# Patient Record
Sex: Male | Born: 1955 | Race: Black or African American | Hispanic: No | Marital: Married | Smoking: Never smoker
Health system: Southern US, Community
[De-identification: ages and names within clinical notes are randomized; demographics above are authoritative.]

## PROBLEM LIST (undated history)

## (undated) DIAGNOSIS — K429 Umbilical hernia without obstruction or gangrene: Secondary | ICD-10-CM

## (undated) DIAGNOSIS — I10 Essential (primary) hypertension: Secondary | ICD-10-CM

## (undated) DIAGNOSIS — E039 Hypothyroidism, unspecified: Secondary | ICD-10-CM

## (undated) DIAGNOSIS — I4819 Other persistent atrial fibrillation: Secondary | ICD-10-CM

## (undated) DIAGNOSIS — E786 Lipoprotein deficiency: Secondary | ICD-10-CM

## (undated) HISTORY — DX: Lipoprotein deficiency: E78.6

## (undated) HISTORY — DX: Hypothyroidism, unspecified: E03.9

## (undated) HISTORY — DX: Essential (primary) hypertension: I10

## (undated) HISTORY — PX: ABDOMINAL SURGERY: SHX537

## (undated) HISTORY — DX: Umbilical hernia without obstruction or gangrene: K42.9

## (undated) HISTORY — DX: Other persistent atrial fibrillation: I48.19

## (undated) HISTORY — PX: OTHER SURGICAL HISTORY: SHX169

---

## 2004-10-04 ENCOUNTER — Ambulatory Visit: Payer: Self-pay | Admitting: Internal Medicine

## 2005-04-10 ENCOUNTER — Ambulatory Visit: Payer: Self-pay | Admitting: Internal Medicine

## 2006-01-24 ENCOUNTER — Ambulatory Visit: Payer: Self-pay | Admitting: Internal Medicine

## 2006-01-31 ENCOUNTER — Ambulatory Visit: Payer: Self-pay | Admitting: Internal Medicine

## 2006-04-17 ENCOUNTER — Ambulatory Visit: Payer: Self-pay | Admitting: Internal Medicine

## 2006-07-02 ENCOUNTER — Ambulatory Visit: Payer: Self-pay | Admitting: Internal Medicine

## 2006-10-03 ENCOUNTER — Ambulatory Visit: Payer: Self-pay | Admitting: Internal Medicine

## 2007-02-14 ENCOUNTER — Ambulatory Visit: Payer: Self-pay | Admitting: Internal Medicine

## 2007-02-14 LAB — CONVERTED CEMR LAB
ALT: 19 units/L (ref 0–40)
AST: 22 units/L (ref 0–37)
Calcium: 8.8 mg/dL (ref 8.4–10.5)
Chloride: 108 meq/L (ref 96–112)
Cholesterol: 187 mg/dL (ref 0–200)
Creatinine, Ser: 0.9 mg/dL (ref 0.4–1.5)
HDL: 31.9 mg/dL — ABNORMAL LOW (ref >39.0)
LDL Cholesterol: 136 mg/dL — ABNORMAL HIGH (ref 0–99)
PSA: 0.78 ng/mL (ref 0.10–4.00)
TSH: 1.01 microintl units/mL (ref 0.35–5.50)
Total CHOL/HDL Ratio: 5.9
Triglycerides: 96 mg/dL (ref 0–149)

## 2007-03-23 ENCOUNTER — Emergency Department (HOSPITAL_COMMUNITY): Admission: EM | Admit: 2007-03-23 | Discharge: 2007-03-23 | Payer: Self-pay | Admitting: Emergency Medicine

## 2007-04-05 ENCOUNTER — Ambulatory Visit: Payer: Self-pay | Admitting: Internal Medicine

## 2007-05-03 ENCOUNTER — Ambulatory Visit: Payer: Self-pay | Admitting: Internal Medicine

## 2007-05-21 DIAGNOSIS — E039 Hypothyroidism, unspecified: Secondary | ICD-10-CM | POA: Insufficient documentation

## 2007-05-22 ENCOUNTER — Ambulatory Visit: Payer: Self-pay | Admitting: Internal Medicine

## 2007-06-07 ENCOUNTER — Ambulatory Visit: Payer: Self-pay | Admitting: Internal Medicine

## 2007-06-07 DIAGNOSIS — J45909 Unspecified asthma, uncomplicated: Secondary | ICD-10-CM | POA: Insufficient documentation

## 2007-06-07 DIAGNOSIS — I10 Essential (primary) hypertension: Secondary | ICD-10-CM | POA: Insufficient documentation

## 2007-07-22 ENCOUNTER — Ambulatory Visit: Payer: Self-pay | Admitting: Internal Medicine

## 2007-07-22 DIAGNOSIS — E785 Hyperlipidemia, unspecified: Secondary | ICD-10-CM | POA: Insufficient documentation

## 2007-07-22 LAB — CONVERTED CEMR LAB
Cholesterol: 170 mg/dL (ref 0–200)
LDL Cholesterol: 129 mg/dL — ABNORMAL HIGH (ref 0–99)
Triglycerides: 97 mg/dL (ref 0–149)

## 2007-07-29 ENCOUNTER — Ambulatory Visit: Payer: Self-pay | Admitting: Internal Medicine

## 2007-08-06 ENCOUNTER — Telehealth: Payer: Self-pay | Admitting: Internal Medicine

## 2007-09-16 ENCOUNTER — Telehealth: Payer: Self-pay | Admitting: *Deleted

## 2007-09-18 ENCOUNTER — Encounter: Payer: Self-pay | Admitting: Internal Medicine

## 2007-09-20 ENCOUNTER — Ambulatory Visit: Payer: Self-pay | Admitting: Internal Medicine

## 2007-09-23 LAB — CONVERTED CEMR LAB
ALT: 15 units/L (ref 0–53)
AST: 19 units/L (ref 0–37)
Albumin: 3.8 g/dL (ref 3.5–5.2)
LDL Cholesterol: 98 mg/dL (ref 0–99)
TSH: 0.06 microintl units/mL — ABNORMAL LOW (ref 0.35–5.50)
VLDL: 9 mg/dL (ref 0–40)

## 2007-09-27 ENCOUNTER — Ambulatory Visit: Payer: Self-pay | Admitting: Internal Medicine

## 2007-09-27 LAB — CONVERTED CEMR LAB: LDL Goal: 130 mg/dL

## 2007-10-01 ENCOUNTER — Encounter: Payer: Self-pay | Admitting: Internal Medicine

## 2008-04-24 ENCOUNTER — Ambulatory Visit: Payer: Self-pay | Admitting: Internal Medicine

## 2008-04-27 LAB — CONVERTED CEMR LAB
BUN: 11 mg/dL (ref 6–23)
CO2: 31 meq/L (ref 19–32)
Calcium: 8.9 mg/dL (ref 8.4–10.5)
Chloride: 104 meq/L (ref 96–112)
GFR calc Af Amer: 114 mL/min
Sodium: 140 meq/L (ref 135–145)
TSH: 2.32 microintl units/mL (ref 0.35–5.50)

## 2008-05-09 IMAGING — CR DG CHEST 2V
2 series · 2 of 2 positions shown · non-contrast
Comparison: 03/23/2007.

Exam: Chest, 2 views.

HISTORY: Cough for 2 weeks.

[view not recorded (1 of 2)]
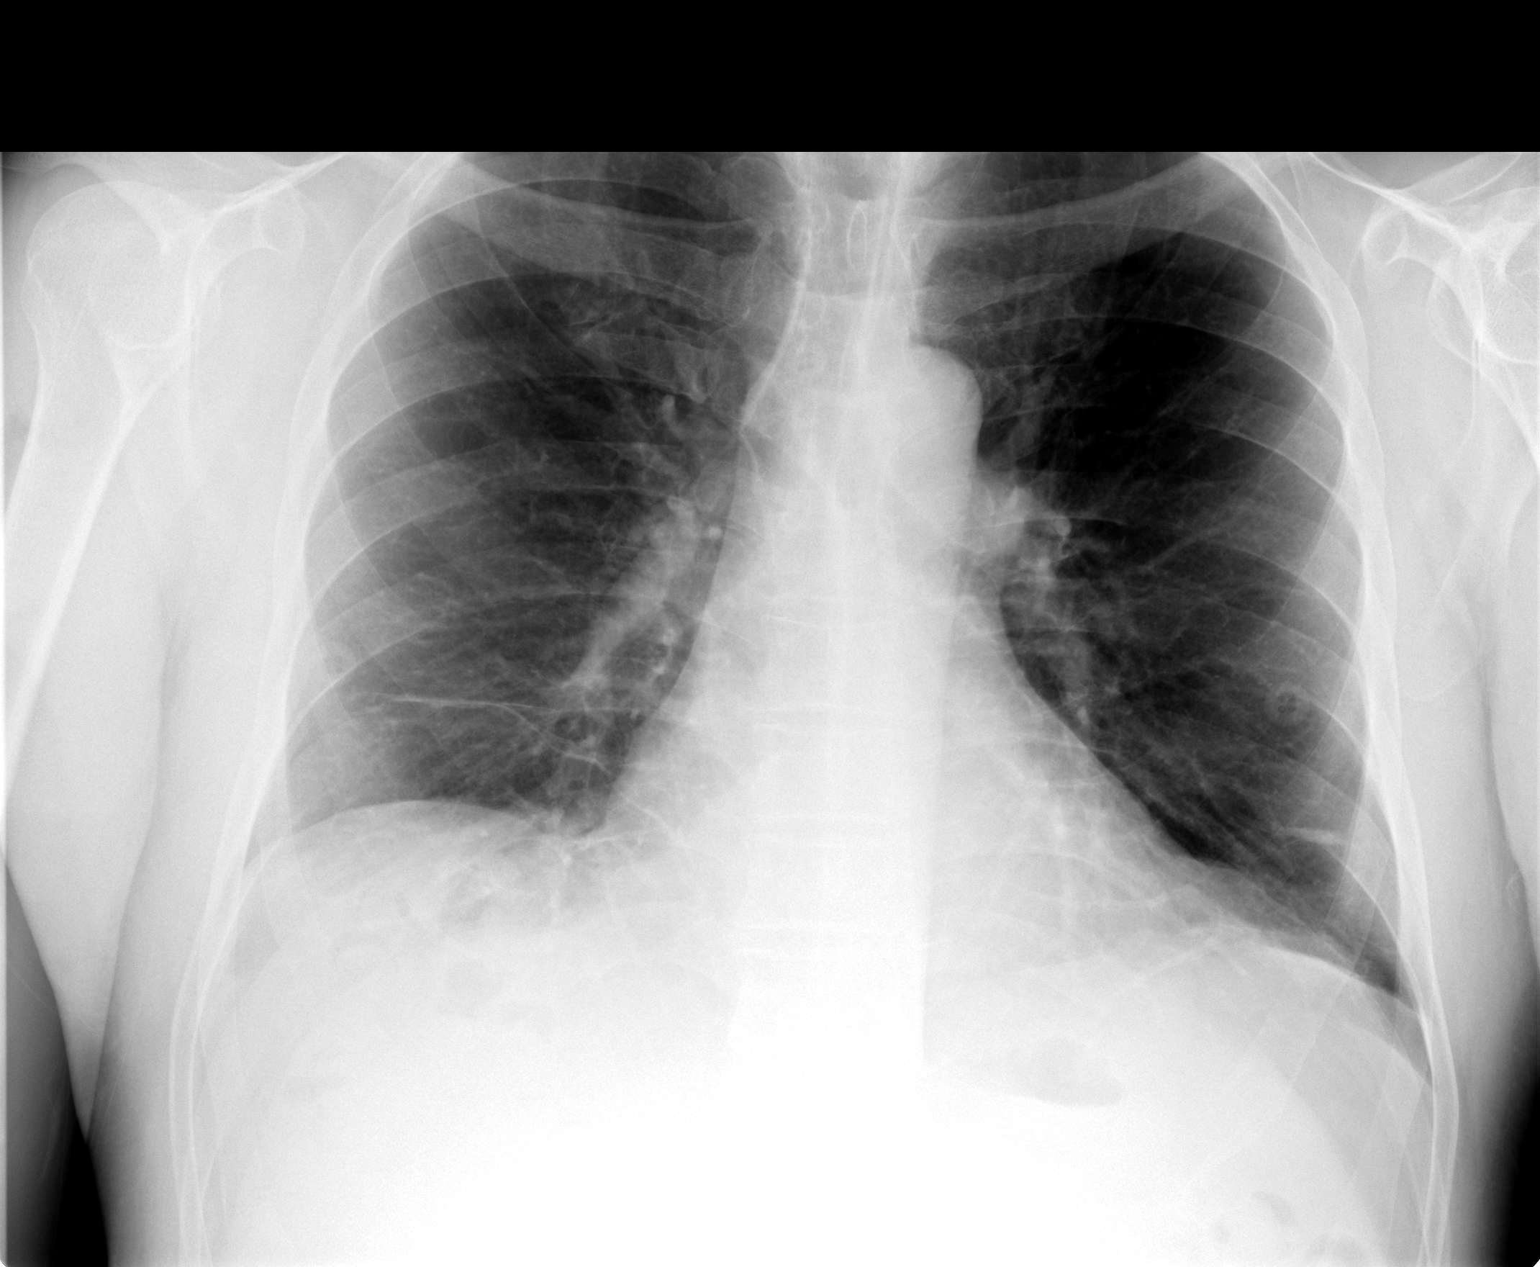

[view not recorded (2 of 2)]
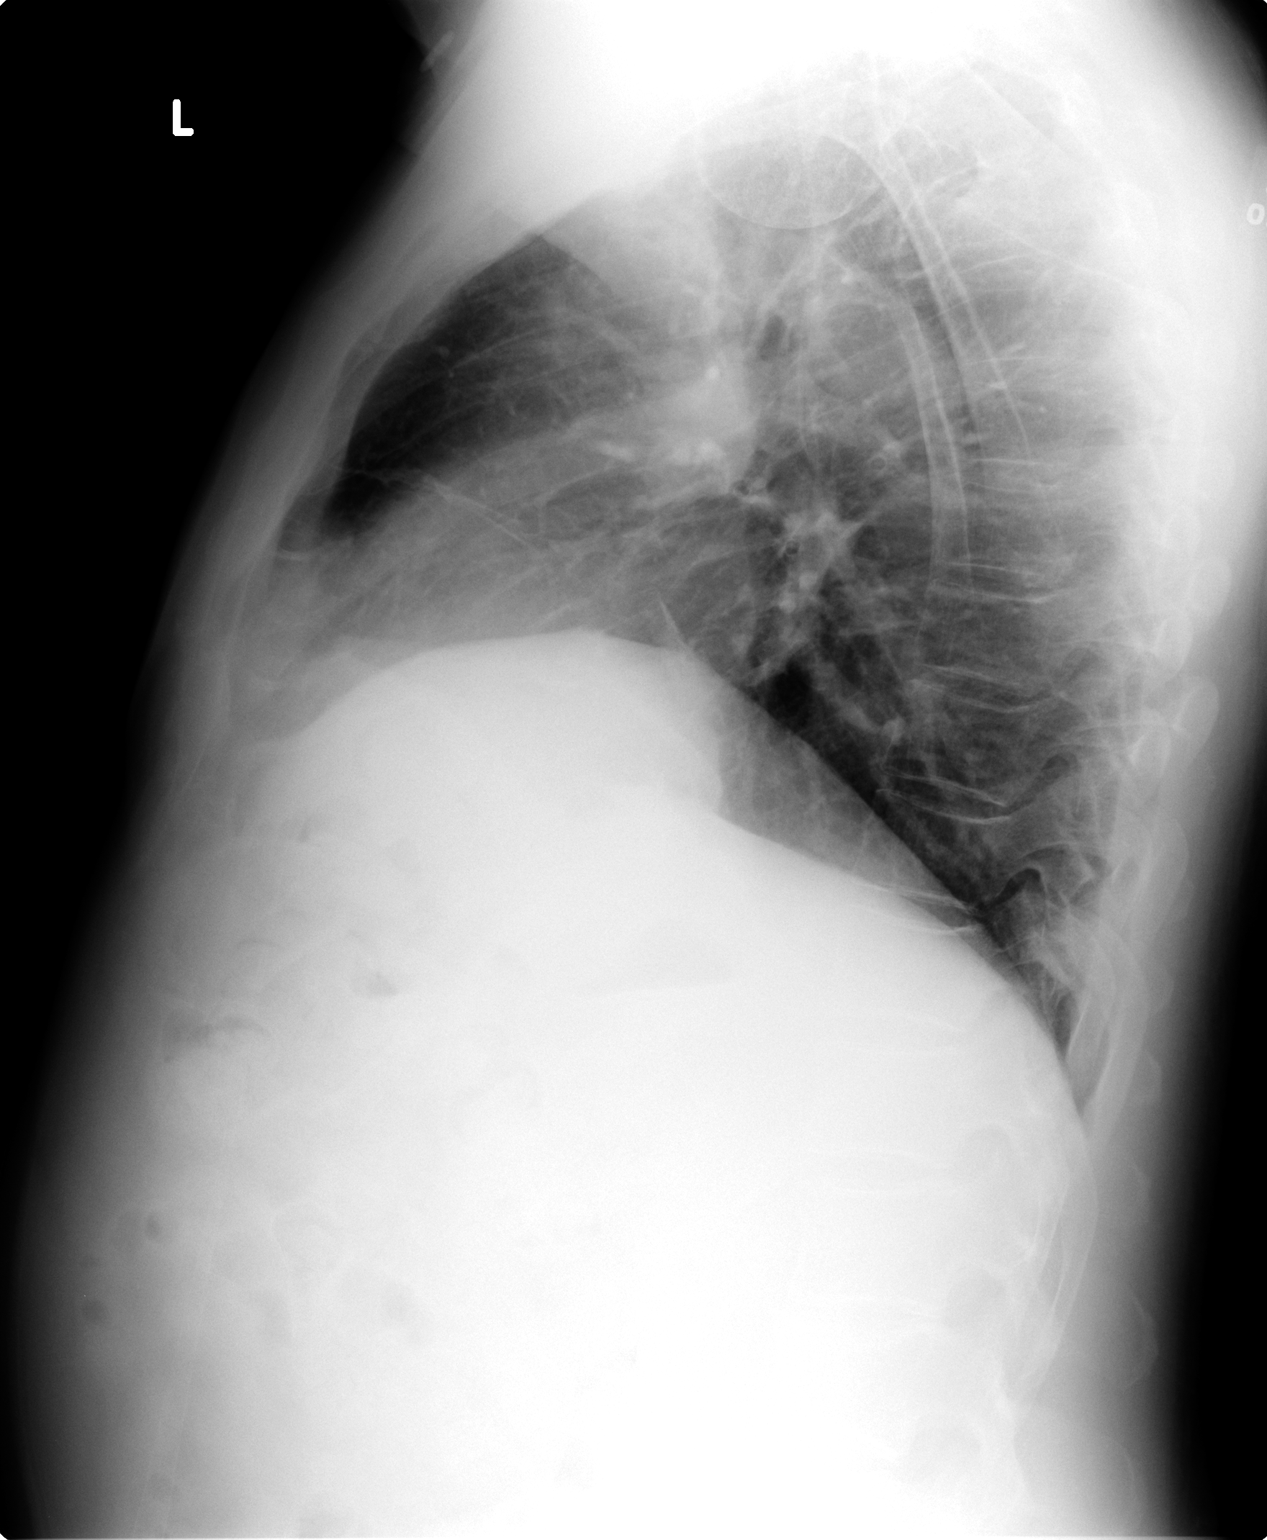

[2 of 2 positions shown; findings below may reference images not displayed]

FINDINGS: Heart size normal.

No pleural effusions or pulmonary edema.

There are low lung volumes with scarring at both lung bases, similar to prior
exam.

No acute airspace disease.
IMPRESSION: 1. Stable low lung volumes with bibasilar scarring.

## 2008-10-12 ENCOUNTER — Telehealth: Payer: Self-pay | Admitting: *Deleted

## 2008-10-16 ENCOUNTER — Telehealth: Payer: Self-pay | Admitting: *Deleted

## 2009-01-15 ENCOUNTER — Ambulatory Visit: Payer: Self-pay | Admitting: Internal Medicine

## 2009-01-15 DIAGNOSIS — R079 Chest pain, unspecified: Secondary | ICD-10-CM | POA: Insufficient documentation

## 2009-01-22 ENCOUNTER — Telehealth: Payer: Self-pay | Admitting: *Deleted

## 2009-03-09 ENCOUNTER — Telehealth (INDEPENDENT_AMBULATORY_CARE_PROVIDER_SITE_OTHER): Payer: Self-pay | Admitting: *Deleted

## 2009-03-12 ENCOUNTER — Ambulatory Visit: Payer: Self-pay | Admitting: Internal Medicine

## 2009-03-12 DIAGNOSIS — J309 Allergic rhinitis, unspecified: Secondary | ICD-10-CM | POA: Insufficient documentation

## 2009-08-10 ENCOUNTER — Telehealth: Payer: Self-pay | Admitting: Internal Medicine

## 2009-09-17 ENCOUNTER — Ambulatory Visit: Payer: Self-pay | Admitting: Internal Medicine

## 2009-09-17 LAB — CONVERTED CEMR LAB
ALT: 18 units/L (ref 0–53)
AST: 20 units/L (ref 0–37)
Albumin: 3.9 g/dL (ref 3.5–5.2)
HDL: 28.6 mg/dL — ABNORMAL LOW (ref >39.00)
Total CHOL/HDL Ratio: 6
Triglycerides: 108 mg/dL (ref 0.0–149.0)
VLDL: 21.6 mg/dL (ref 0.0–40.0)

## 2009-10-13 ENCOUNTER — Ambulatory Visit: Payer: Self-pay | Admitting: Internal Medicine

## 2009-10-15 ENCOUNTER — Telehealth: Payer: Self-pay | Admitting: *Deleted

## 2009-12-27 ENCOUNTER — Telehealth: Payer: Self-pay | Admitting: Internal Medicine

## 2009-12-27 ENCOUNTER — Ambulatory Visit: Payer: Self-pay | Admitting: Internal Medicine

## 2009-12-27 DIAGNOSIS — J111 Influenza due to unidentified influenza virus with other respiratory manifestations: Secondary | ICD-10-CM | POA: Insufficient documentation

## 2010-01-04 ENCOUNTER — Telehealth: Payer: Self-pay | Admitting: Internal Medicine

## 2010-01-28 ENCOUNTER — Ambulatory Visit: Payer: Self-pay | Admitting: Internal Medicine

## 2010-01-28 LAB — CONVERTED CEMR LAB
ALT: 16 units/L (ref 0–53)
Alkaline Phosphatase: 91 units/L (ref 39–117)
Basophils Relative: 0.6 % (ref 0.0–3.0)
Bilirubin Urine: NEGATIVE
Bilirubin, Direct: 0.1 mg/dL (ref 0.0–0.3)
Blood in Urine, dipstick: NEGATIVE
CO2: 32 meq/L (ref 19–32)
Calcium: 9.2 mg/dL (ref 8.4–10.5)
Chloride: 105 meq/L (ref 96–112)
Cholesterol: 187 mg/dL (ref 0–200)
Eosinophils Relative: 2.1 % (ref 0.0–5.0)
GFR calc non Af Amer: 99.53 mL/min (ref >60)
Glucose, Bld: 97 mg/dL (ref 70–99)
Glucose, Urine, Semiquant: NEGATIVE
Lymphocytes Relative: 23.1 % (ref 12.0–46.0)
Neutrophils Relative %: 65.1 % (ref 43.0–77.0)
PSA: 0.77 ng/mL (ref 0.10–4.00)
Platelets: 199 10*3/uL (ref 150.0–400.0)
Potassium: 3.8 meq/L (ref 3.5–5.1)
RBC: 5.21 M/uL (ref 4.22–5.81)
Sodium: 143 meq/L (ref 135–145)
Specific Gravity, Urine: 1.02
Total Bilirubin: 0.6 mg/dL (ref 0.3–1.2)
Total Protein: 8.3 g/dL (ref 6.0–8.3)
WBC Urine, dipstick: NEGATIVE
WBC: 7 10*3/uL (ref 4.5–10.5)
pH: 7

## 2010-03-29 ENCOUNTER — Ambulatory Visit: Payer: Self-pay | Admitting: Internal Medicine

## 2010-08-01 ENCOUNTER — Telehealth: Payer: Self-pay | Admitting: *Deleted

## 2010-12-04 LAB — CONVERTED CEMR LAB
ALT: 17 units/L (ref 0–53)
AST: 19 units/L (ref 0–37)
Albumin: 3.6 g/dL (ref 3.5–5.2)
Basophils Relative: 0.9 % (ref 0.0–3.0)
CO2: 32 meq/L (ref 19–32)
Creatinine, Ser: 0.8 mg/dL (ref 0.4–1.5)
GFR calc Af Amer: 130 mL/min
Glucose, Bld: 99 mg/dL (ref 70–99)
HCT: 43.9 % (ref 39.0–52.0)
HDL: 25 mg/dL — ABNORMAL LOW (ref >39.0)
Hemoglobin: 15.2 g/dL (ref 13.0–17.0)
LDL Cholesterol: 111 mg/dL — ABNORMAL HIGH (ref 0–99)
Magnesium: 2 mg/dL (ref 1.5–2.5)
Neutro Abs: 3.2 10*3/uL (ref 1.4–7.7)
Platelets: 149 10*3/uL — ABNORMAL LOW (ref 150–400)
Potassium: 4 meq/L (ref 3.5–5.1)
RBC: 5.13 M/uL (ref 4.22–5.81)
Sodium: 141 meq/L (ref 135–145)
T3, Free: 2.9 pg/mL (ref 2.3–4.2)
Total Bilirubin: 0.8 mg/dL (ref 0.3–1.2)
Total CHOL/HDL Ratio: 6.5
Total Protein: 7.8 g/dL (ref 6.0–8.3)
VLDL: 26 mg/dL (ref 0–40)

## 2010-12-08 NOTE — Progress Notes (Signed)
Summary: call triaged  Phone Note Refill Request   Refills Requested: Medication #1:  PROFAIR HFA (90 BASE) MCG/ACT AERS (Albuterol Sulfate)  1-2 puffs every 6 hours as needed for asthma   Notes: [facility] [address]  Not on current med list but pt adv that he needs this med to help him breath better.   Initial call taken by: Debbra Riding,  August 01, 2010 9:38 AM  Follow-up for Phone Call        please triage this note and refill proair  med x 1  He has a hx of  asthma and  needs assessment  whether needs ov also  Follow-up by: Madelin Headings MD,  August 01, 2010 10:06 AM  Additional Follow-up for Phone Call Additional follow up Details #1::        Says he has shortness of breath, dry cough, congestion chest at night.  The albuterol helps him & he can sleep.  Otherwise sitting up all night.  No fever.  He says it's related to seasonal allergies and comes in the fall.  He says Dr. Demetrius Charity knows this & has given him the Proair before.  He will come in for ov if the albuterol is needed more than 2 times weekly, he gets fever, the congestion gets worse, or the shortness of breath gets worse.   Additional Follow-up by: Rudy Jew, RN,  August 01, 2010 10:49 AM    Additional Follow-up for Phone Call Additional follow up Details #2::    I agree with above.  Follow-up by: Madelin Headings MD,  August 01, 2010 12:43 PM  New/Updated Medications: PROAIR HFA 108 (90 BASE) MCG/ACT AERS (ALBUTEROL SULFATE) 1-2 puffs every 6 hours as needed asthma Prescriptions: PROAIR HFA 108 (90 BASE) MCG/ACT AERS (ALBUTEROL SULFATE) 1-2 puffs every 6 hours as needed asthma  #1 x 0   Entered by:   Rudy Jew, RN   Authorized by:   Madelin Headings MD   Signed by:   Rudy Jew, RN on 08/01/2010   Method used:   Elect[facility] to        [facility] # [phone]* (retail)       [address] [place], [place]  [address]       Ph: [phone]       Fax:  [phone]   RxID:   [id number]

## 2010-12-08 NOTE — Assessment & Plan Note (Signed)
Summary: cpx/ssc   Vital Signs:  Patient profile:   55 year old male Height:      74 inches Weight:      206 pounds BMI:     26.54 Temp:     98.1 degrees F oral Pulse rate:   45 / minute BP sitting:   130 / 90  (right arm)  Vitals Entered By: Kathrynn Speed CMA (Mar 29, 2010 11:16 AM)  Serial Vital Signs/Assessments:  Time      Position  BP       Pulse  Resp  Temp     By                     782/95                         Madelin Headings MD  Comments: right arm sitting By: Madelin Headings MD   CC: CPX / RX Synthroid, Hypertension Management/ Not taking Liptor   History of Present Illness: James Nunez comes in today   for cpx. His labs were done last month but coulnt get in because of work schedule.Also never got colonsoscopy because of this problem. Since last visit  here  there have been no major changes in health status  . Asthma ok   Had decrease taste  on  advair so stopped it.   and taste came back. Using  advair almost as needed for his asthma.  BP doing well sometmes high nl but  in range. No se of meds LIPIDS: stopped med liptor cause of cost of meds.  THyroid : Taking meds out x 2 days.   Hypertension History:      He denies headache, chest pain, palpitations, dyspnea with exertion, peripheral edema, visual symptoms, neurologic problems, syncope, and side effects from treatment.  He notes no problems with any antihypertensive medication side effects.        Positive major cardiovascular risk factors include male age 90 years old or older, hyperlipidemia, and hypertension.  Negative major cardiovascular risk factors include non-tobacco-user status.        Further assessment for target organ damage reveals no history of ASHD, stroke/TIA, or peripheral vascular disease.     Preventive Screening-Counseling & Management  Alcohol-Tobacco     Alcohol drinks/day: 0     Smoking Status: never     Year Quit: age 21  Caffeine-Diet-Exercise     Caffeine use/day: less  than 1 a day     Does Patient Exercise: no  Current Medications (verified): 1)  Bayer Aspirin 325 Mg  Tabs (Aspirin) .Marland Kitchen.. 1 By Mouth Once Daily 2)  Verapamil Hcl Cr 240 Mg  Cp24 (Verapamil Hcl) .Marland Kitchen.. 1 By Mouth Once Daily 3)  Verapamil Hcl Cr 120 Mg  Cp24 (Verapamil Hcl) .... 1/2  By Mouth Once Daily 4)  Advair Diskus 100-50 Mcg/dose  Misc (Fluticasone-Salmeterol) .... As Needed 5)  Lipitor 40 Mg  Tabs (Atorvastatin Calcium) .Marland Kitchen.. 1 By Mouth Once Daily or As Directed 6)  Synthroid 112 Mcg  Tabs (Levothyroxine Sodium) .Marland Kitchen.. 1 By Mouth Once Daily Samples  Allergies (verified): 1)  ! Zocor (Simvastatin)  Past History:  Past medical, surgical, family and social histories (including risk factors) reviewed, and no changes noted (except as noted below).  Past Medical History: Reviewed history from 03/12/2009 and no changes required. 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 Denies surgical history  Family History: Reviewed history from 01/15/2009 and no changes required. Fam hx Stroke mom  2 Family History Diabetes 1st degree relative Family History Other cancer-Stomach age 28 father  Family History of Asthma sibs ? etoh in sib    Social History: Reviewed history from 10/13/2009 and no changes required. 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  long hours.  50 hours per week.  does physcaal loading.    Review of Systems  The patient denies anorexia, fever, weight loss, vision loss, decreased hearing, hoarseness, chest pain, syncope, dyspnea on exertion, peripheral edema, prolonged cough, headaches, hemoptysis, abdominal pain, melena, hematochezia, severe  indigestion/heartburn, hematuria, incontinence, muscle weakness, transient blindness, difficulty walking, depression, unusual weight change, abnormal bleeding, enlarged lymph nodes, angioedema, and testicular masses.   Physical Exam General Appearance: well developed, well nourished, no acute distress Eyes: conjunctiva and lids normal, PERRLA, EOMI, WNL Ears, Nose, Mouth, Throat: TM clear, nares clear, oral exam WNL Neck: supple, no lymphadenopathy, no thyromegaly, no JVD Respiratory: clear to auscultation and percussion, respiratory effort normal Cardiovascular: regular rate and rhythm, S1-S2, no murmur, rub or gallop, no bruits, peripheral pulses normal and symmetric, no cyanosis, clubbing, edema or varicosities Chest: no scars, masses, tenderness; no asymmetry, skin changes, nipple discharge, no gynecomastia   Gastrointestinal: soft, non-tender; no hepatosplenomegaly, masses; active bowel sounds all quadrants,  Genitourinary: decliined  Lymphatic: no cervical, axillary or inguinal adenopathy Musculoskeletal: gait normal, muscle tone and strength WNL, no joint swelling, effusions, discoloration, crepitus  Skin: clear, good turgor, color WNL, no rashes, lesions, or ulcerations Neurologic: normal mental status, normal reflexes, normal strength, sensation, and motion Psychiatric: alert; oriented to person, place and time Other Exam:  see labs    off lipitor     Impression & Recommendations:  Problem # 1:  PREVENTIVE HEALTH CARE (ICD-V70.0) Discussed nutrition,exercise,diet,healthy weight, vitamin D and calcium.   Problem # 2:  ASTHMA (ICD-493.90)  The following medications were removed from the medication list:    Advair Diskus 100-50 Mcg/dose Misc (Fluticasone-salmeterol) .Marland Kitchen... As needed    Prednisone 20 Mg Tabs (Prednisone) .Marland Kitchen... 1 by mouth three times a day for 3 days then two times a day for 3 days His updated medication list for this problem includes:    Symbicort 160-4.5 Mcg/act  Aero (Budesonide-formoterol fumarate) .Marland Kitchen... 2 puffs two times a day  Problem # 3:  HYPERLIPIDEMIA (ICD-272.2) will do lifestyle intervention     for now until lipitor goes generic. The following medications were removed from the medication list:    Lipitor 40 Mg Tabs (Atorvastatin calcium) .Marland Kitchen... 1 by mouth once daily or as directed  Labs Reviewed: SGOT: 19 (01/28/2010)   SGPT: 16 (01/28/2010)  Lipid Goals: Chol Goal: 200 (09/27/2007)   HDL Goal: 40 (09/27/2007)   LDL Goal: 130 (09/27/2007)   TG Goal: 150 (09/27/2007)  10 Yr Risk Heart Disease: 22 % Prior 10 Yr Risk Heart Disease: 14 % (10/13/2009)   HDL:33.00 (01/28/2010), 28.60 (09/17/2009)  LDL:132 (01/28/2010), 129 (09/17/2009)  Chol:187 (01/28/2010), 179 (09/17/2009)  Trig:111.0 (01/28/2010), 108.0 (09/17/2009)  Problem # 4:  HYPOTHYROIDISM (ICD-244.9)  His updated medication list for this problem includes:    Synthroid 112 Mcg Tabs (Levothyroxine sodium) .Marland KitchenMarland KitchenMarland KitchenMarland Kitchen  1 by mouth once daily samples  Problem # 5:  HYPERTENSION (ICD-401.9) Assessment: Unchanged  His updated medication list for this problem includes:    Verapamil Hcl Cr 240 Mg Cp24 (Verapamil hcl) .Marland Kitchen... 1 by mouth once daily    Verapamil Hcl Cr 120 Mg Cp24 (Verapamil hcl) .Marland Kitchen... 1/2  by mouth once daily  BP today: 130/90 Prior BP: 120/80 (12/27/2009)  10 Yr Risk Heart Disease: 22 % Prior 10 Yr Risk Heart Disease: 14 % (10/13/2009)  Labs Reviewed: K+: 3.8 (01/28/2010) Creat: : 1.0 (01/28/2010)   Chol: 187 (01/28/2010)   HDL: 33.00 (01/28/2010)   LDL: 132 (01/28/2010)   TG: 111.0 (01/28/2010)  Complete Medication List: 1)  Bayer Aspirin 325 Mg Tabs (Aspirin) .Marland Kitchen.. 1 by mouth once daily 2)  Verapamil Hcl Cr 240 Mg Cp24 (Verapamil hcl) .Marland Kitchen.. 1 by mouth once daily 3)  Verapamil Hcl Cr 120 Mg Cp24 (Verapamil hcl) .... 1/2  by mouth once daily 4)  Synthroid 112 Mcg Tabs (Levothyroxine sodium) .Marland Kitchen.. 1 by mouth once daily samples 5)  Symbicort 160-4.5 Mcg/act Aero  (Budesonide-formoterol fumarate) .... 2 puffs two times a day  Other Orders: Pneumococcal Vaccine ([address]) Admin 1st Vaccine (60454)  Hypertension Assessment/Plan:      The patient's hypertensive risk group is category B: At least one risk factor (excluding diabetes) with no target organ damage.  His calculated 10 year risk of coronary heart disease is 22 %.  Today's blood pressure is 130/90.  His blood pressure goal is < 140/90.  Patient Instructions: 1)  keep blood pressure under control 2)  can try symbicort instead of advai.r 3)  exercise and healthy eating  4)  consider restart lipitor when generic unless better with lifestyle intervention . 5)  cpx in a year with labs  unless Blood pressure or asthma  a problem.  6)  Call GI  about  sceduling colonscopy with his work schedule .  Prescriptions: SYNTHROID 112 MCG  TABS (LEVOTHYROXINE SODIUM) 1 by mouth once daily samples Brand medically necessary #30 Tablet x 12   Entered and Authorized by:   Madelin Headings MD   Signed by:   Madelin Headings MD on 03/29/2010   Method used:   Electronically to        [facility] # [phone]* (retail)       [address] [place], [place]  [address]       Ph: [phone]       Fax: [phone]   RxID:   [id number]    Immunizations Administered:  Pneumonia Vaccine:    Vaccine Type: Pneumovax    Site: right deltoid    Mfr: Merck    Dose: 0.5 ml    Route: IM    Given by: Kathrynn Speed CMA    Exp. Date: 06/11/2011    Lot #: [id number]    VIS given: 06/03/96 version given Mar 29, 2010.

## 2010-12-08 NOTE — Progress Notes (Signed)
Summary: xray result  Phone Note Call from Patient   Caller: Patient Call For: Madelin Headings MD Summary of Call: calling about xray result. [phone] Initial call taken by: Raechel Ache, RN,  December 27, 2009 5:02 PM  Follow-up for Phone Call        no pneumonia     would like  you to start  prednisone 20 mg three times a day for 3 days and two times a day for 3 days   disp 15  Also Zpack  Call if not better in 3 days or as needed.  Follow-up by: Madelin Headings MD,  December 27, 2009 5:26 PM  Additional Follow-up for Phone Call Additional follow up Details #1::        pt aware, rx sent in electronically Additional Follow-up by: Alfred Levins, CMA,  December 27, 2009 5:34 PM    New/Updated Medications: ZITHROMAX Z-PAK 250 MG  TABS (AZITHROMYCIN) Use as directed PREDNISONE 20 MG TABS (PREDNISONE) 1 by mouth three times a day for 3 days then two times a day for 3 days Prescriptions: PREDNISONE 20 MG TABS (PREDNISONE) 1 by mouth three times a day for 3 days then two times a day for 3 days  #15 x 0   Entered by:   Alfred Levins, CMA   Authorized by:   Madelin Headings MD   Signed by:   Alfred Levins, CMA on 12/27/2009   Method used:   Electronically to        [facility] # [phone]* (retail)       [address] [place], [place]  [address]       Ph: [phone]       Fax: [phone]   RxID:   [id number] ZITHROMAX Z-PAK 250 MG  TABS (AZITHROMYCIN) Use as directed  #1 x 0   Entered by:   Alfred Levins, CMA   Authorized by:   Madelin Headings MD   Signed by:   Alfred Levins, CMA on 12/27/2009   Method used:   Electronically to        [facility] # [phone]* (retail)       [address] [place], [place]  [address]       Ph: [phone]       Fax: [phone]   RxID:   [id number]

## 2010-12-08 NOTE — Assessment & Plan Note (Signed)
Summary: congestion//ccm   Vital Signs:  Patient profile:   55 year old male Height:      74 inches Weight:      206 pounds BMI:     26.54 O2 Sat:      97 % on Room air Temp:     98.6 degrees F oral Pulse rate:   65 / minute BP sitting:   120 / 80  (right arm) Cuff size:   large  Vitals Entered By: Romualdo Bolk, CMA (AAMA) (December 27, 2009 11:04 AM)  O2 Flow:  Room air CC: Coughing, congestion, fever 102 x 4 days with body aches. Then started with wheezing and coughing up on 2/18pm. The fever started on 2/16.   History of Present Illness: James Nunez comes in today for above acute concern.  Had 4 days of fever  102  and myalgias  and cough  and now cough getting better but then got shortness of breath  when laid down.  over the last 2-3 days.    early on took ibuprofen.   fluid intake ok.  Self rx albuterol no help  and   continues cant work.     Nyquil     / help.    Didnt get flu shot this year " doesnt usually get flu".  No cp NVD . no rash  Preventive Screening-Counseling & Management  Alcohol-Tobacco     Alcohol drinks/day: 0     Smoking Status: never     Year Quit: age 35  Caffeine-Diet-Exercise     Caffeine use/day: less than 1 a day     Does Patient Exercise: no  Current Medications (verified): 1)  Bayer Aspirin 325 Mg  Tabs (Aspirin) .Marland Kitchen.. 1 By Mouth Once Daily 2)  Verapamil Hcl Cr 240 Mg  Cp24 (Verapamil Hcl) .Marland Kitchen.. 1 By Mouth Once Daily 3)  Verapamil Hcl Cr 120 Mg  Cp24 (Verapamil Hcl) .... 1/2  By Mouth Once Daily 4)  Advair Diskus 100-50 Mcg/dose  Misc (Fluticasone-Salmeterol) .Marland Kitchen.. 1p Bid 5)  Lipitor 40 Mg  Tabs (Atorvastatin Calcium) .Marland Kitchen.. 1 By Mouth Once Daily or As Directed 6)  Synthroid 112 Mcg  Tabs (Levothyroxine Sodium) .Marland Kitchen.. 1 By Mouth Once Daily Samples  Allergies (verified): 1)  ! Zocor (Simvastatin)  Past History:  Past medical, surgical, family and social histories (including risk factors) reviewed, and no changes noted (except as  noted below).  Past Medical History: Reviewed history from 03/12/2009 and no changes required. Hypothyroidism S/P hyperthyroid heart failure and RAI rx Decreased hdl Hx cardiomyopathy from hyperthyroidism  echo mild LVH  03  ?asthma  Umbilical hernia  Allergic rhinitis  Past Surgical History: Reviewed history from 09/27/2007 and no changes required. Denies surgical history  Past History:  Care Management: None current  Family History: Reviewed history from 01/15/2009 and no changes required. Fam hx Stroke mom  43 Family History Diabetes 1st degree relative Family History Other cancer-Stomach age 19 father  Family History of Asthma sibs ? etoh in sib    Social History: Reviewed history from 10/13/2009 and no changes required. 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  long hours.  50 hours per week.  does physcaal loading.    Review of Systems       The patient complains of anorexia and  fever.  The patient denies weight loss, weight gain, vision loss, decreased hearing, chest pain, syncope, dyspnea on exertion, peripheral edema, prolonged cough, headaches, abdominal pain, melena, hematochezia, severe indigestion/heartburn, muscle weakness, difficulty walking, abnormal bleeding, enlarged lymph nodes, and angioedema.    Physical Exam  General:  alert, well-developed, well-nourished, and well-hydrated.  coughing and congested  non toxic  Head:  Normocephalic and atraumatic without obvious abnormalities. No apparent alopecia or balding. Eyes:  clear  Ears:  R ear normal, L ear normal, and no external deformities.   Nose:  no external deformity and no external erythema.  mucoid dc no face pain Mouth:  right tonsil 1+ pnd seen yellow white  Neck:  No deformities, masses, or tenderness noted. Chest Wall:  No deformities, masses, tenderness or  gynecomastia noted. Lungs:  few  wheezes at bases   good bs normal respiratory effort, no intercostal retractions, no accessory muscle use, and no crackles.   Heart:  normal rate, regular rhythm, no murmur, no gallop, and no lifts.   Abdomen:  Bowel sounds positive,abdomen soft and non-tender without masses, organomegaly or   noted. Pulses:  nl cpa refill  Extremities:  no clubbing cyanosis or edema  Neurologic:  non focal  Skin:  turgor normal, color normal, no ecchymoses, and no petechiae.   Cervical Nodes:  No lymphadenopathy noted Psych:  Oriented X3, good eye contact, not anxious appearing, and not depressed appearing.     Impression & Recommendations:  Problem # 1:  DYSPNEA SUPINE (ICD-786.05) good pulse ox  r/o pneumomia     Orders: T-2 View CXR (71020TC)  Problem # 2:  INFLUENZA LIKE ILLNESS (ICD-487.1) convalescent  t and fever abatng but  Orders: T-2 View CXR (71020TC)  Problem # 3:  ASTHMA (ICD-493.90)  His updated medication list for this problem includes:    Advair Diskus 100-50 Mcg/dose Misc (Fluticasone-salmeterol) .Marland Kitchen... 1p bid    Prednisone 20 Mg Tabs (Prednisone) .Marland Kitchen... 1 by mouth three times a day for 3 days then two times a day for 3 days  Complete Medication List: 1)  Bayer Aspirin 325 Mg Tabs (Aspirin) .Marland Kitchen.. 1 by mouth once daily 2)  Verapamil Hcl Cr 240 Mg Cp24 (Verapamil hcl) .Marland Kitchen.. 1 by mouth once daily 3)  Verapamil Hcl Cr 120 Mg Cp24 (Verapamil hcl) .... 1/2  by mouth once daily 4)  Advair Diskus 100-50 Mcg/dose Misc (Fluticasone-salmeterol) .Marland Kitchen.. 1p bid 5)  Lipitor 40 Mg Tabs (Atorvastatin calcium) .Marland Kitchen.. 1 by mouth once daily or as directed 6)  Synthroid 112 Mcg Tabs (Levothyroxine sodium) .Marland Kitchen.. 1 by mouth once daily samples 7)  Hydrocodone-homatropine 5-1.5 Mg/45ml Syrp (Hydrocodone-homatropine) .Marland Kitchen.. 1-2 tsp by mouth q 4-6 hour as needed cough 8)  Zithromax Z-pak 250 Mg Tabs (Azithromycin) .... Use as directed 9)  Prednisone 20 Mg Tabs (Prednisone) .Marland Kitchen.. 1  by mouth three times a day for 3 days then two times a day for 3 days  Patient Instructions: 1)  Get a chest x ray   2)  will call and decide on treatment  3)  May Have a sinus infection and wheezing from this. 4)  Restart your advair in the meantime. Prescriptions: HYDROCODONE-HOMATROPINE 5-1.5 MG/5ML SYRP (HYDROCODONE-HOMATROPINE) 1-2 tsp by mouth q 4-6 hour as needed cough  #6 oz x 0   Entered and Authorized by:   Madelin Headings MD   Signed by:   Madelin Headings MD on 12/27/2009   Method used:   Print then Give to Patient  RxID:   [id number]  [id number]  cell

## 2010-12-08 NOTE — Progress Notes (Signed)
Summary: please advise of lost of taste  Phone Note Call from Patient Call back at Home Phone [phone]   Caller: Patient-----voicemail Reason for Call: Talk to Nurse Summary of Call: Was seen last week with uri and was given rxs. As of Saturday, he has lost his sense of taste. He has a burning sensation inside his mouth. Tongue feels like that it has been scorched. Please advise. Initial call taken by: Warnell Forester,  January 04, 2010 11:17 AM  Follow-up for Phone Call        Spoke to pt and he has lost his sense of taste on sat and tongue has a burning and numb. He states that if he had a cup of coffee it would burn his tongue. Pt states that his other signs are gone. Pt has finished the medication on Sunday or Monday. Follow-up by: Romualdo Bolk, CMA Duncan Dull),  January 04, 2010 1:02 PM  Additional Follow-up for Phone Call Additional follow up Details #1::        unsure if med or other. If he has any white spots or red spots  thrush is possible  but usually pain and not nubmness.  if signs persistent and progressive  when would recheck his exam .     other wise avoid  any mouthwashes or  whitener toothpastes.   Additional Follow-up by: Madelin Headings MD,  January 04, 2010 1:24 PM    Additional Follow-up for Phone Call Additional follow up Details #2::    Pt aware and did have a white coating on his tongue. But this has went away. Pt will call us if he gets worse. Follow-up by: Romualdo Bolk, CMA (AAMA),  January 04, 2010 2:56 PM

## 2010-12-15 IMAGING — CR DG CHEST 2V
2 series · 2 of 2 positions shown · non-contrast
Comparison: 05/22/2007

CLINICAL DATA: Cough, shortness of breath

CHEST - 2 VIEW

[view not recorded (1 of 2)]
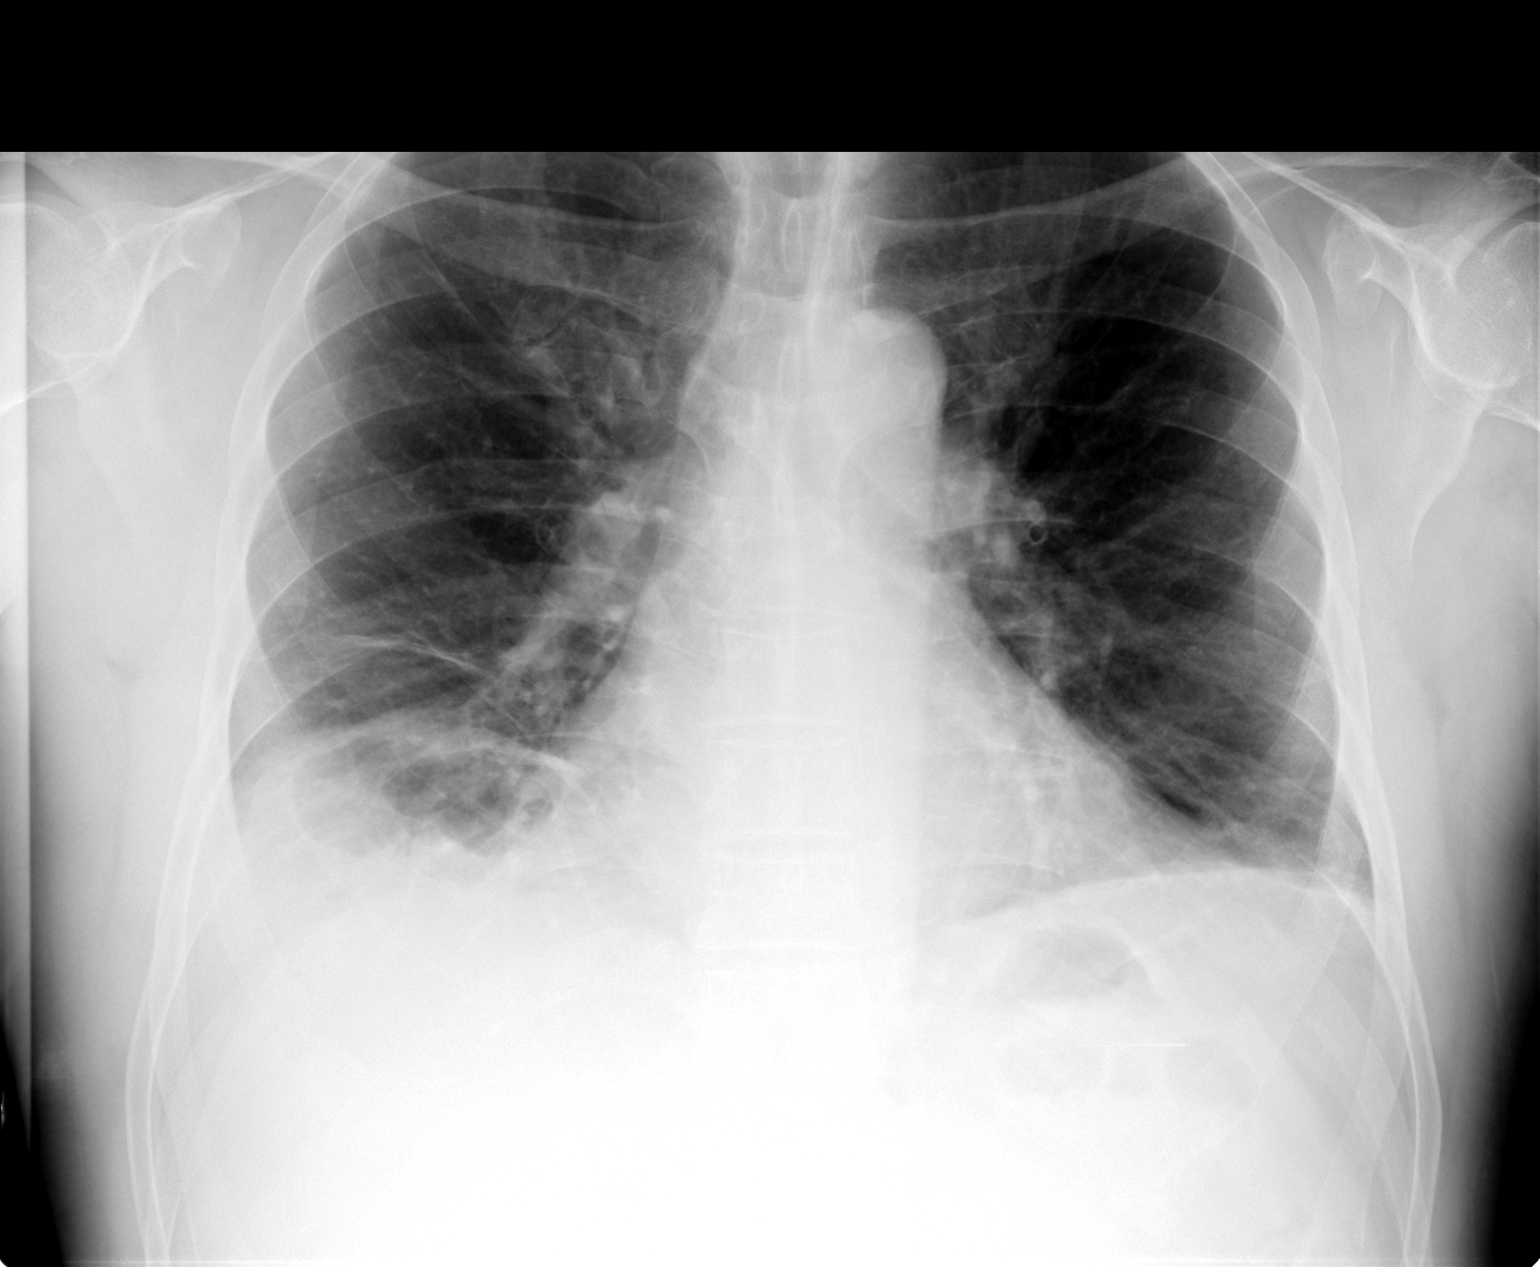

[view not recorded (2 of 2)]
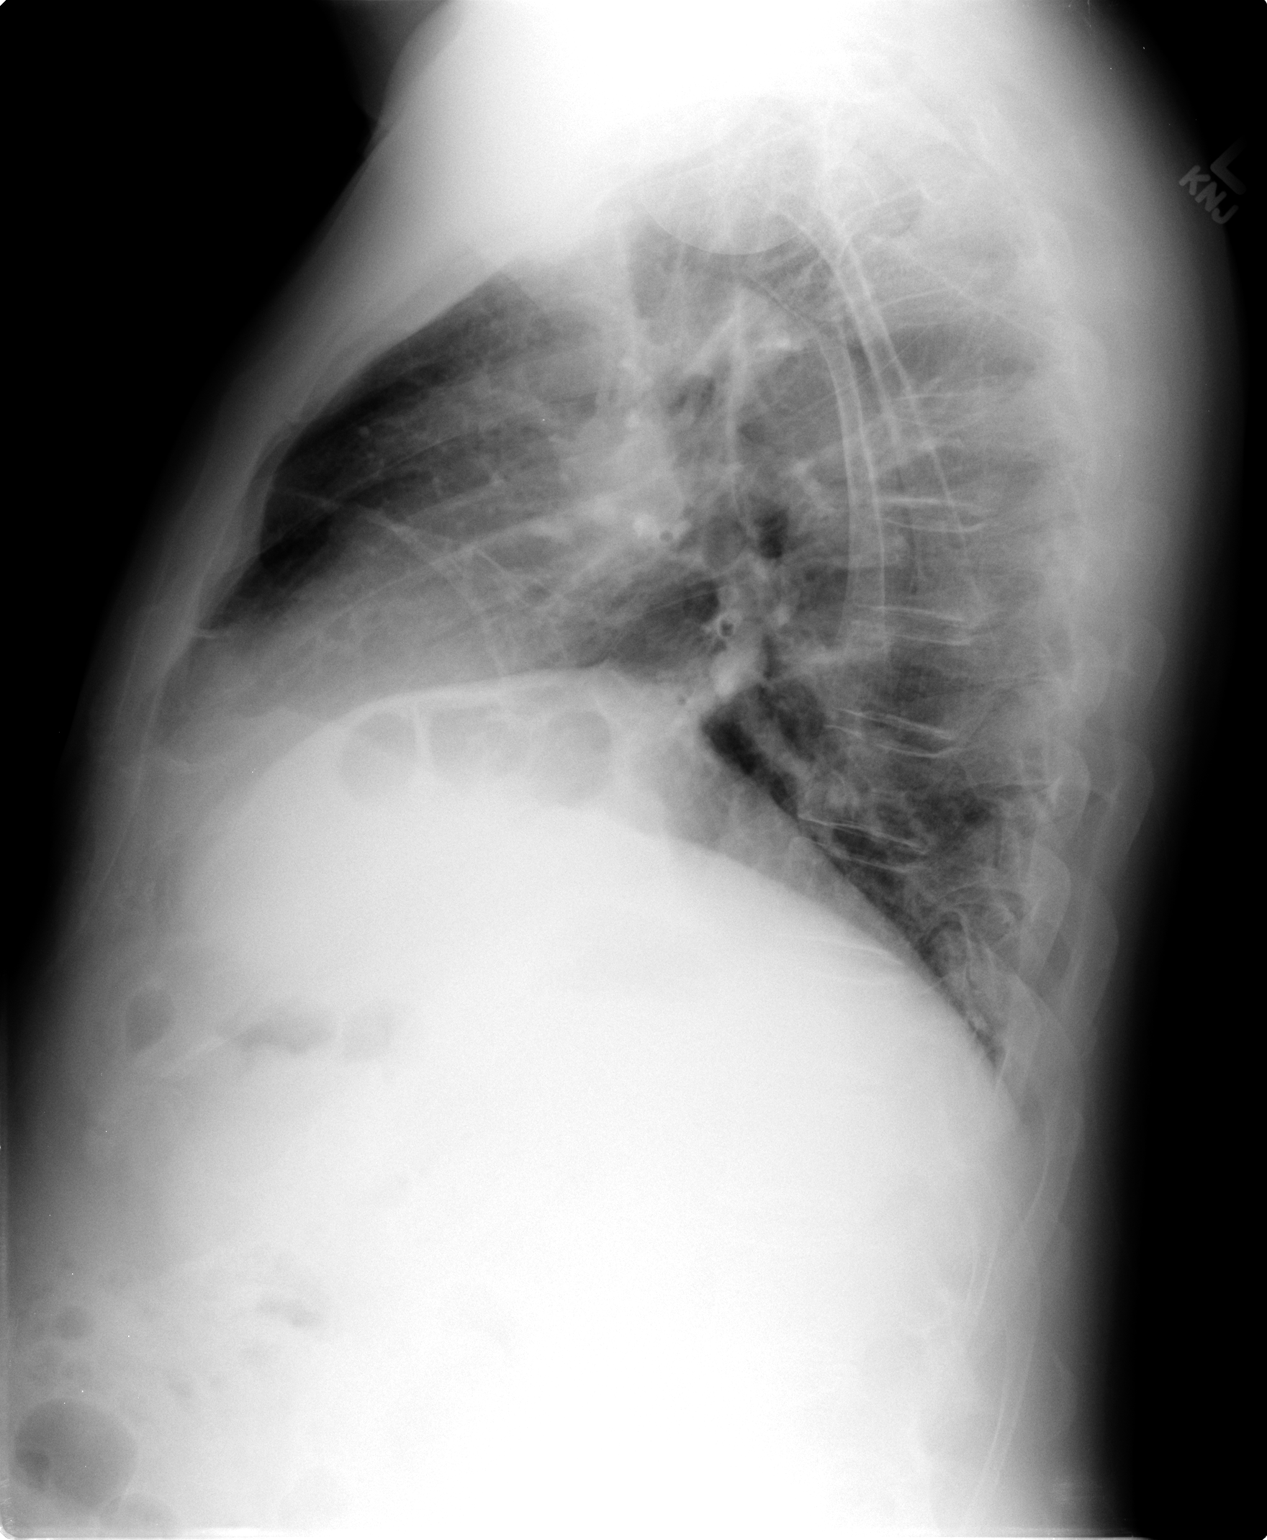

[2 of 2 positions shown; findings below may reference images not displayed]

FINDINGS: There is bibasilar atelectasis.  No effusions.  Heart is
borderline in size. No acute bony abnormality.
IMPRESSION: Increasing bibasilar atelectasis.

## 2011-03-20 ENCOUNTER — Other Ambulatory Visit: Payer: Self-pay | Admitting: Internal Medicine

## 2011-04-10 ENCOUNTER — Other Ambulatory Visit: Payer: Self-pay | Admitting: Internal Medicine

## 2011-04-10 NOTE — Telephone Encounter (Signed)
Pt is due for a cpx. Letter mailed to pt.

## 2011-04-16 ENCOUNTER — Other Ambulatory Visit: Payer: Self-pay | Admitting: Internal Medicine

## 2011-04-17 NOTE — Telephone Encounter (Signed)
Pt needs to schedule a cpx. Rx sent to pharmacy.

## 2011-05-09 ENCOUNTER — Other Ambulatory Visit: Payer: Self-pay | Admitting: Internal Medicine

## 2011-05-09 NOTE — Telephone Encounter (Signed)
rx sent to pharmacy and letter sent to pt saying that he is due for a cpx appt.

## 2011-05-23 ENCOUNTER — Other Ambulatory Visit: Payer: Self-pay | Admitting: Internal Medicine

## 2011-06-12 ENCOUNTER — Other Ambulatory Visit: Payer: Self-pay | Admitting: Internal Medicine

## 2011-06-21 ENCOUNTER — Telehealth: Payer: Self-pay | Admitting: Internal Medicine

## 2011-06-21 MED ORDER — SYNTHROID 112 MCG PO TABS: ORAL_TABLET | ORAL | Status: DC

## 2011-06-21 NOTE — Telephone Encounter (Signed)
Pt is coming in for his cpx on 07/17/11 but will be out of his prescription of SYNTHROID 112 MCG tablet  Pt requesting refill to hold him over until cpx.  [facility]

## 2011-06-21 NOTE — Telephone Encounter (Signed)
rx sent to pharmacy

## 2011-07-05 ENCOUNTER — Other Ambulatory Visit (INDEPENDENT_AMBULATORY_CARE_PROVIDER_SITE_OTHER): Payer: Managed Care, Other (non HMO)

## 2011-07-05 DIAGNOSIS — Z0000 Encounter for general adult medical examination without abnormal findings: Secondary | ICD-10-CM

## 2011-07-05 LAB — POCT URINALYSIS DIPSTICK
Bilirubin, UA: NEGATIVE
Blood, UA: NEGATIVE
Glucose, UA: NEGATIVE
Ketones, UA: NEGATIVE
Leukocytes, UA: NEGATIVE
Nitrite, UA: NEGATIVE
Protein, UA: NEGATIVE
Spec Grav, UA: 1.025
Urobilinogen, UA: 0.2
pH, UA: 6

## 2011-07-05 LAB — LIPID PANEL
Cholesterol: 162 mg/dL (ref 0–200)
LDL Cholesterol: 101 mg/dL — ABNORMAL HIGH (ref 0–99)
Triglycerides: 158 mg/dL — ABNORMAL HIGH (ref 0.0–149.0)

## 2011-07-05 LAB — CBC WITH DIFFERENTIAL/PLATELET
Eosinophils Relative: 5.8 % — ABNORMAL HIGH (ref 0.0–5.0)
HCT: 46.6 % (ref 39.0–52.0)
Hemoglobin: 15.2 g/dL (ref 13.0–17.0)
Lymphs Abs: 2.2 10*3/uL (ref 0.7–4.0)
Monocytes Relative: 8.9 % (ref 3.0–12.0)
Platelets: 176 10*3/uL (ref 150.0–400.0)
RBC: 5.3 Mil/uL (ref 4.22–5.81)
WBC: 7.3 10*3/uL (ref 4.5–10.5)

## 2011-07-05 LAB — PSA: PSA: 0.65 ng/mL (ref 0.10–4.00)

## 2011-07-05 LAB — HEPATIC FUNCTION PANEL
ALT: 19 U/L (ref 0–53)
AST: 22 U/L (ref 0–37)
Total Bilirubin: 0.3 mg/dL (ref 0.3–1.2)

## 2011-07-05 LAB — BASIC METABOLIC PANEL
BUN: 12 mg/dL (ref 6–23)
GFR: 94.64 mL/min (ref >60.00)
Potassium: 4.7 mEq/L (ref 3.5–5.1)
Sodium: 141 mEq/L (ref 135–145)

## 2011-07-05 LAB — TSH: TSH: 1.29 u[IU]/mL (ref 0.35–5.50)

## 2011-07-13 ENCOUNTER — Encounter: Payer: Self-pay | Admitting: Internal Medicine

## 2011-07-17 ENCOUNTER — Ambulatory Visit (INDEPENDENT_AMBULATORY_CARE_PROVIDER_SITE_OTHER): Payer: Self-pay | Admitting: Internal Medicine

## 2011-07-17 ENCOUNTER — Encounter: Payer: Self-pay | Admitting: Internal Medicine

## 2011-07-17 DIAGNOSIS — R05 Cough: Secondary | ICD-10-CM

## 2011-07-17 DIAGNOSIS — J309 Allergic rhinitis, unspecified: Secondary | ICD-10-CM

## 2011-07-17 DIAGNOSIS — E039 Hypothyroidism, unspecified: Secondary | ICD-10-CM

## 2011-07-17 DIAGNOSIS — Z79899 Other long term (current) drug therapy: Secondary | ICD-10-CM | POA: Insufficient documentation

## 2011-07-17 DIAGNOSIS — K429 Umbilical hernia without obstruction or gangrene: Secondary | ICD-10-CM

## 2011-07-17 DIAGNOSIS — J45909 Unspecified asthma, uncomplicated: Secondary | ICD-10-CM

## 2011-07-17 DIAGNOSIS — Z0000 Encounter for general adult medical examination without abnormal findings: Secondary | ICD-10-CM

## 2011-07-17 DIAGNOSIS — Z1211 Encounter for screening for malignant neoplasm of colon: Secondary | ICD-10-CM

## 2011-07-17 DIAGNOSIS — E786 Lipoprotein deficiency: Secondary | ICD-10-CM

## 2011-07-17 DIAGNOSIS — R059 Cough, unspecified: Secondary | ICD-10-CM

## 2011-07-17 DIAGNOSIS — R7301 Impaired fasting glucose: Secondary | ICD-10-CM

## 2011-07-17 DIAGNOSIS — I10 Essential (primary) hypertension: Secondary | ICD-10-CM

## 2011-07-17 MED ORDER — LEVOTHYROXINE SODIUM 112 MCG PO TABS: 112.0000 ug | ORAL_TABLET | Freq: Every day | ORAL | Status: DC

## 2011-07-17 MED ORDER — VERAPAMIL HCL ER 120 MG PO TBCR: EXTENDED_RELEASE_TABLET | ORAL | Status: DC

## 2011-07-17 MED ORDER — VERAPAMIL HCL ER 240 MG PO TBCR: 240.0000 mg | EXTENDED_RELEASE_TABLET | Freq: Every day | ORAL | Status: DC

## 2011-07-17 MED ORDER — ALBUTEROL SULFATE HFA 108 (90 BASE) MCG/ACT IN AERS: INHALATION_SPRAY | RESPIRATORY_TRACT | Status: DC

## 2011-07-17 NOTE — Assessment & Plan Note (Signed)
No recent flare  ocass cough.

## 2011-07-17 NOTE — Patient Instructions (Signed)
Avoid simple sugars and sweets cause to prevent diabetes. Continue lifestyle intervention healthy eating and exercise . dont go long time without   Eating a healthy snack .  Recheck in a year.  Otherwise

## 2011-07-17 NOTE — Assessment & Plan Note (Signed)
Disc  lsi strategy.

## 2011-07-17 NOTE — Assessment & Plan Note (Signed)
No change in regimen.

## 2011-07-17 NOTE — Assessment & Plan Note (Signed)
No change  regimen

## 2011-07-17 NOTE — Progress Notes (Signed)
  Subjective:    Patient ID: James Nunez, male    DOB: 09-25-1956, 55 y.o.   MRN: [id number]  HPI Patient comes in today for preventive visit and follow-up of medical issues. Update of her history since her last visit. hypertension ocass skipping.  Thyroid:  Exerlise walking no limitation.    Review of Systems     Objective:   Physical Exam  Physical Exam: Vital signs reviewed WUJ:WJXB is a well-developed well-nourished alert cooperative  AA male  who appears   stated age in no acute distress.  HEENT: normocephalic  traumatic , Eyes: PERRL EOM's full, conjunctiva clear, Nares: patent no deformity discharge or tenderness., Ears: no deformity EAC's clear TMs with normal landmarks. Mouth: clear OP, no lesions, edema.  Moist mucous membranes. Dentition in adequate repair. NECK: supple without masses, thyromegaly or bruits. CHEST/PULM:  Clear to auscultation and percussion breath sounds equal no wheeze , rales or rhonchi. No chest wall deformities or tenderness. CV: PMI is nondisplaced, S1 S2 no gallops, murmurs, rubs. Peripheral pulses are full without delay.No JVD .  ABDOMEN: Bowel sounds normal nontender  No guard or rebound, no hepato splenomegal no CVA tenderness.  2 fb hernia non tender Extremtities:  No clubbing cyanosis or edema, no acute joint swelling or redness no focal atrophy NEURO:  Oriented x3, cranial nerves 3-12 appear to be intact, no obvious focal weakness,gait within normal limits no abnormal reflexes or asymmetrical SKIN: No acute rashes normal turgor, color, no bruising or petechiae. PSYCH: Oriented, good eye contact, no obvious depression anxiety, cognition and judgment appear normal. LN:  No cervical axillary or inguinal adenopathy Rectal decline no prostate sx .  Lab Results  Component Value Date   WBC 7.3 07/05/2011   HGB 15.2 07/05/2011   HCT 46.6 07/05/2011   PLT 176.0 07/05/2011   CHOL 162 07/05/2011   TRIG 158.0* 07/05/2011   HDL 29.60* 07/05/2011   ALT 19 07/05/2011   AST 22 07/05/2011   NA 141 07/05/2011   K 4.7 07/05/2011   CL 106 07/05/2011   CREATININE 1.0 07/05/2011   BUN 12 07/05/2011   CO2 29 07/05/2011   TSH 1.29 07/05/2011   PSA 0.65 07/05/2011       Assessment & Plan:  Preventive Health Care Counseled regarding healthy nutrition, exercise, sleep, injury prevention, calcium vit d and healthy weight . Decline flu vaccine  Ref for colonoscopy Hypothyroid HT Hyperglycemia new problem   Counseled. About diet limit carbs and sugars.  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

## 2011-07-18 ENCOUNTER — Encounter: Payer: Self-pay | Admitting: Internal Medicine

## 2011-08-22 ENCOUNTER — Other Ambulatory Visit: Payer: Self-pay | Admitting: Internal Medicine

## 2012-02-10 ENCOUNTER — Other Ambulatory Visit: Payer: Self-pay | Admitting: Internal Medicine

## 2012-07-31 ENCOUNTER — Other Ambulatory Visit: Payer: Self-pay | Admitting: Internal Medicine

## 2012-09-03 ENCOUNTER — Other Ambulatory Visit: Payer: Self-pay | Admitting: Internal Medicine

## 2012-09-07 ENCOUNTER — Other Ambulatory Visit: Payer: Self-pay | Admitting: Internal Medicine

## 2012-10-28 ENCOUNTER — Other Ambulatory Visit (INDEPENDENT_AMBULATORY_CARE_PROVIDER_SITE_OTHER): Payer: 59

## 2012-10-28 DIAGNOSIS — Z0000 Encounter for general adult medical examination without abnormal findings: Secondary | ICD-10-CM

## 2012-10-28 LAB — BASIC METABOLIC PANEL
BUN: 10 mg/dL (ref 6–23)
CO2: 27 mEq/L (ref 19–32)
Calcium: 9 mg/dL (ref 8.4–10.5)
GFR: 109.9 mL/min (ref >60.00)
Glucose, Bld: 112 mg/dL — ABNORMAL HIGH (ref 70–99)
Potassium: 3.7 mEq/L (ref 3.5–5.1)

## 2012-10-28 LAB — LIPID PANEL
Cholesterol: 159 mg/dL (ref 0–200)
HDL: 28.9 mg/dL — ABNORMAL LOW (ref >39.00)
LDL Cholesterol: 100 mg/dL — ABNORMAL HIGH (ref 0–99)
Triglycerides: 152 mg/dL — ABNORMAL HIGH (ref 0.0–149.0)

## 2012-10-28 LAB — HEPATIC FUNCTION PANEL
AST: 22 U/L (ref 0–37)
Albumin: 3.6 g/dL (ref 3.5–5.2)
Total Bilirubin: 0.4 mg/dL (ref 0.3–1.2)

## 2012-10-28 LAB — CBC WITH DIFFERENTIAL/PLATELET
Basophils Absolute: 0 10*3/uL (ref 0.0–0.1)
Eosinophils Absolute: 0.3 10*3/uL (ref 0.0–0.7)
HCT: 43.7 % (ref 39.0–52.0)
Hemoglobin: 14.4 g/dL (ref 13.0–17.0)
Lymphs Abs: 1.7 10*3/uL (ref 0.7–4.0)
MCHC: 33 g/dL (ref 30.0–36.0)
Monocytes Relative: 8.6 % (ref 3.0–12.0)
Neutro Abs: 3.3 10*3/uL (ref 1.4–7.7)
Platelets: 171 10*3/uL (ref 150.0–400.0)
RDW: 13.8 % (ref 11.5–14.6)

## 2012-10-30 ENCOUNTER — Other Ambulatory Visit: Payer: Self-pay | Admitting: Internal Medicine

## 2012-11-04 ENCOUNTER — Ambulatory Visit (INDEPENDENT_AMBULATORY_CARE_PROVIDER_SITE_OTHER): Payer: 59 | Admitting: Internal Medicine

## 2012-11-04 ENCOUNTER — Encounter: Payer: Self-pay | Admitting: Internal Medicine

## 2012-11-04 VITALS — BP 160/88 | HR 48 | Temp 98.3°F | Ht 73.0 in | Wt 200.0 lb

## 2012-11-04 DIAGNOSIS — I10 Essential (primary) hypertension: Secondary | ICD-10-CM

## 2012-11-04 DIAGNOSIS — E782 Mixed hyperlipidemia: Secondary | ICD-10-CM

## 2012-11-04 DIAGNOSIS — E786 Lipoprotein deficiency: Secondary | ICD-10-CM

## 2012-11-04 DIAGNOSIS — J45909 Unspecified asthma, uncomplicated: Secondary | ICD-10-CM

## 2012-11-04 DIAGNOSIS — K429 Umbilical hernia without obstruction or gangrene: Secondary | ICD-10-CM

## 2012-11-04 DIAGNOSIS — Z0000 Encounter for general adult medical examination without abnormal findings: Secondary | ICD-10-CM

## 2012-11-04 DIAGNOSIS — E039 Hypothyroidism, unspecified: Secondary | ICD-10-CM

## 2012-11-04 DIAGNOSIS — Z23 Encounter for immunization: Secondary | ICD-10-CM

## 2012-11-04 DIAGNOSIS — R739 Hyperglycemia, unspecified: Secondary | ICD-10-CM

## 2012-11-04 DIAGNOSIS — R7301 Impaired fasting glucose: Secondary | ICD-10-CM

## 2012-11-04 MED ORDER — LISINOPRIL 20 MG PO TABS: 20.0000 mg | ORAL_TABLET | Freq: Every day | ORAL | Status: DC

## 2012-11-04 NOTE — Assessment & Plan Note (Signed)
Get blood pressure and control optimize lifestyle intervention consider other interventions

## 2012-11-04 NOTE — Patient Instructions (Signed)
Your blood pressure is elevated today confirming what you have at home. We will add another medicine to take once a day in addition to your verapamil and followup in about 6 weeks or so. We may repeat blood work at that time Your blood sugar fasting is a bit elevated in the prediabetic range avoid simple sugars try to get some aerobic exercise.  If her pulse is persistently under 45 we may have to adjust the verapamil dose. The initial pulse was 48 on repeat was 60.   Fat and Cholesterol Control Diet Cholesterol levels in your body are determined significantly by your diet. Cholesterol levels may also be related to heart disease. The following material helps to explain this relationship and discusses what you can do to help keep your heart healthy. Not all cholesterol is bad. Low-density lipoprotein (LDL) cholesterol is the "bad" cholesterol. It may cause fatty deposits to build up inside your arteries. High-density lipoprotein (HDL) cholesterol is "good." It helps to remove the "bad" LDL cholesterol from your blood. Cholesterol is a very important risk factor for heart disease. Other risk factors are high blood pressure, smoking, stress, heredity, and weight. The heart muscle gets its supply of blood through the coronary arteries. If your LDL cholesterol is high and your HDL cholesterol is low, you are at risk for having fatty deposits build up in your coronary arteries. This leaves less room through which blood can flow. Without sufficient blood and oxygen, the heart muscle cannot function properly and you may feel chest pains (angina pectoris). When a coronary artery closes up entirely, a part of the heart muscle may die causing a heart attack (myocardial infarction). CHECKING CHOLESTEROL When your caregiver sends your blood to a lab to be examined for cholesterol, a complete lipid (fat) profile may be done. With this test, the total amount of cholesterol and levels of LDL and HDL are determined.  Triglycerides are a type of fat that circulates in the blood. They can also be used to determine heart disease risk. The list below describes what the numbers should be: Test: Total Cholesterol.  Less than 200 mg/dl. Test: LDL "bad cholesterol."  Less than 100 mg/dl.  Less than 70 mg/dl if you are at very high risk of a heart attack or sudden cardiac death. Test: HDL "good cholesterol."  Greater than 50 mg/dl for women.  Greater than 40 mg/dl for men. Test: Triglycerides.  Less than 150 mg/dl. CONTROLLING CHOLESTEROL WITH DIET Although exercise and lifestyle factors are important, your diet is key. That is because certain foods are known to raise cholesterol and others to lower it. The goal is to balance foods for their effect on cholesterol and more importantly, to replace saturated and trans fat with other types of fat, such as monounsaturated fat, polyunsaturated fat, and omega-3 fatty acids. On average, a person should consume no more than 15 to 17 g of saturated fat daily. Saturated and trans fats are considered "bad" fats, and they will raise LDL cholesterol. Saturated fats are primarily found in animal products such as meats, butter, and cream. However, that does not mean you need to give up all your favorite foods. Today, there are good tasting, low-fat, low-cholesterol substitutes for most of the things you like to eat. Choose low-fat or nonfat alternatives. Choose round or loin cuts of red meat. These types of cuts are lowest in fat and cholesterol. Chicken (without the skin), fish, veal, and ground [place] breast are great choices. Eliminate fatty meats, such  as hot dogs and salami. Even shellfish have little or no saturated fat. Have a 3 oz (85 g) portion when you eat lean meat, poultry, or fish. Trans fats are also called "partially hydrogenated oils." They are oils that have been scientifically manipulated so that they are solid at room temperature resulting in a longer shelf life  and improved taste and texture of foods in which they are added. Trans fats are found in stick margarine, some tub margarines, cookies, crackers, and baked goods.  When baking and cooking, oils are a great substitute for butter. The monounsaturated oils are especially beneficial since it is believed they lower LDL and raise HDL. The oils you should avoid entirely are saturated tropical oils, such as coconut and palm.  Remember to eat a lot from food groups that are naturally free of saturated and trans fat, including fish, fruit, vegetables, beans, grains (barley, rice, couscous, bulgur wheat), and pasta (without cream sauces).  IDENTIFYING FOODS THAT LOWER CHOLESTEROL  Soluble fiber may lower your cholesterol. This type of fiber is found in fruits such as apples, vegetables such as broccoli, potatoes, and carrots, legumes such as beans, peas, and lentils, and grains such as barley. Foods fortified with plant sterols (phytosterol) may also lower cholesterol. You should eat at least 2 g per day of these foods for a cholesterol lowering effect.  Read package labels to identify low-saturated fats, trans fat free, and low-fat foods at the supermarket. Select cheeses that have only 2 to 3 g saturated fat per ounce. Use a heart-healthy tub margarine that is free of trans fats or partially hydrogenated oil. When buying baked goods (cookies, crackers), avoid partially hydrogenated oils. Breads and muffins should be made from whole grains (whole-wheat or whole oat flour, instead of "flour" or "enriched flour"). Buy non-creamy canned soups with reduced salt and no added fats.  FOOD PREPARATION TECHNIQUES  Never deep-fry. If you must fry, either stir-fry, which uses very little fat, or use non-stick cooking sprays. When possible, broil, bake, or roast meats, and steam vegetables. Instead of putting butter or margarine on vegetables, use lemon and herbs, applesauce, and cinnamon (for squash and sweet potatoes), nonfat  yogurt, salsa, and low-fat dressings for salads.  LOW-SATURATED FAT / LOW-FAT FOOD SUBSTITUTES Meats / Saturated Fat (g)  Avoid: Steak, marbled (3 oz/85 g) / 11 g  Choose: Steak, lean (3 oz/85 g) / 4 g  Avoid: Hamburger (3 oz/85 g) / 7 g  Choose: Hamburger, lean (3 oz/85 g) / 5 g  Avoid: Ham (3 oz/85 g) / 6 g  Choose: Ham, lean cut (3 oz/85 g) / 2.4 g  Avoid: Chicken, with skin, dark meat (3 oz/85 g) / 4 g  Choose: Chicken, skin removed, dark meat (3 oz/85 g) / 2 g  Avoid: Chicken, with skin, light meat (3 oz/85 g) / 2.5 g  Choose: Chicken, skin removed, light meat (3 oz/85 g) / 1 g Dairy / Saturated Fat (g)  Avoid: Whole milk (1 cup) / 5 g  Choose: Low-fat milk, 2% (1 cup) / 3 g  Choose: Low-fat milk, 1% (1 cup) / 1.5 g  Choose: Skim milk (1 cup) / 0.3 g  Avoid: Hard cheese (1 oz/28 g) / 6 g  Choose: Skim milk cheese (1 oz/28 g) / 2 to 3 g  Avoid: Cottage cheese, 4% fat (1 cup) / 6.5 g  Choose: Low-fat cottage cheese, 1% fat (1 cup) / 1.5 g  Avoid: Ice cream (1 cup) /  9 g  Choose: Sherbet (1 cup) / 2.5 g  Choose: Nonfat frozen yogurt (1 cup) / 0.3 g  Choose: Frozen fruit bar / trace  Avoid: Whipped cream (1 tbs) / 3.5 g  Choose: Nondairy whipped topping (1 tbs) / 1 g Condiments / Saturated Fat (g)  Avoid: Mayonnaise (1 tbs) / 2 g  Choose: Low-fat mayonnaise (1 tbs) / 1 g  Avoid: Butter (1 tbs) / 7 g  Choose: Extra light margarine (1 tbs) / 1 g  Avoid: Coconut oil (1 tbs) / 11.8 g  Choose: Olive oil (1 tbs) / 1.8 g  Choose: Corn oil (1 tbs) / 1.7 g  Choose: Safflower oil (1 tbs) / 1.2 g  Choose: Sunflower oil (1 tbs) / 1.4 g  Choose: Soybean oil (1 tbs) / 2.4 g  Choose: Canola oil (1 tbs) / 1 g Document Released: 10/23/2005 Document Revised: 01/15/2012 Document Reviewed: 04/13/2011 [facility] Patient Information 2013 [place], [place].   Preventive Care for Adults, Male A healthy lifestyle and preventive care can promote health and wellness.  Preventive health guidelines for men include the following key practices:  A routine yearly physical is a good way to check with your caregiver about your health and preventative screening. It is a chance to share any concerns and updates on your health, and to receive a thorough exam.  Visit your dentist for a routine exam and preventative care every 6 months. Brush your teeth twice a day and floss once a day. Good oral hygiene prevents tooth decay and gum disease.  The frequency of eye exams is based on your age, health, family medical history, use of contact lenses, and other factors. Follow your caregiver's recommendations for frequency of eye exams.  Eat a healthy diet. Foods like vegetables, fruits, whole grains, low-fat dairy products, and lean protein foods contain the nutrients you need without too many calories. Decrease your intake of foods high in solid fats, added sugars, and salt. Eat the right amount of calories for you.Get information about a proper diet from your caregiver, if necessary.  Regular physical exercise is one of the most important things you can do for your health. Most adults should get at least 150 minutes of moderate-intensity exercise (any activity that increases your heart rate and causes you to sweat) each week. In addition, most adults need muscle-strengthening exercises on 2 or more days a week.  Maintain a healthy weight. The body mass index (BMI) is a screening tool to identify possible weight problems. It provides an estimate of body fat based on height and weight. Your caregiver can help determine your BMI, and can help you achieve or maintain a healthy weight.For adults 20 years and older:  A BMI below 18.5 is considered underweight.  A BMI of 18.5 to 24.9 is normal.  A BMI of 25 to 29.9 is considered overweight.  A BMI of 30 and above is considered obese.  Maintain normal blood lipids and cholesterol levels by exercising and minimizing your intake of  saturated fat. Eat a balanced diet with plenty of fruit and vegetables. Blood tests for lipids and cholesterol should begin at age 68 and be repeated every 5 years. If your lipid or cholesterol levels are high, you are over 50, or you are a high risk for heart disease, you may need your cholesterol levels checked more frequently.Ongoing high lipid and cholesterol levels should be treated with medicines if diet and exercise are not effective.  If you smoke, find out from  your caregiver how to quit. If you do not use tobacco, do not start.  If you choose to drink alcohol, do not exceed 2 drinks per day. One drink is considered to be 12 ounces (355 mL) of beer, 5 ounces (148 mL) of wine, or 1.5 ounces (44 mL) of liquor.  Avoid use of street drugs. Do not share needles with anyone. Ask for help if you need support or instructions about stopping the use of drugs.  High blood pressure causes heart disease and increases the risk of stroke. Your blood pressure should be checked at least every 1 to 2 years. Ongoing high blood pressure should be treated with medicines, if weight loss and exercise are not effective.  If you are 52 to 58 years old, ask your caregiver if you should take aspirin to prevent heart disease.  Diabetes screening involves taking a blood sample to check your fasting blood sugar level. This should be done once every 3 years, after age 28, if you are within normal weight and without risk factors for diabetes. Testing should be considered at a younger age or be carried out more frequently if you are overweight and have at least 1 risk factor for diabetes.  Colorectal cancer can be detected and often prevented. Most routine colorectal cancer screening begins at the age of 83 and continues through age 29. However, your caregiver may recommend screening at an earlier age if you have risk factors for colon cancer. On a yearly basis, your caregiver may provide home test kits to check for hidden  blood in the stool. Use of a small camera at the end of a tube, to directly examine the colon (sigmoidoscopy or colonoscopy), can detect the earliest forms of colorectal cancer. Talk to your caregiver about this at age 99, when routine screening begins. Direct examination of the colon should be repeated every 5 to 10 years through age 74, unless early forms of pre-cancerous polyps or small growths are found.  Hepatitis C blood testing is recommended for all people born from [birth year] through 1965 and any individual with known risks for hepatitis C.  Practice safe sex. Use condoms and avoid high-risk sexual practices to reduce the spread of sexually transmitted infections (STIs). STIs include gonorrhea, chlamydia, syphilis, trichomonas, herpes, HPV, and human immunodeficiency virus (HIV). Herpes, HIV, and HPV are viral illnesses that have no cure. They can result in disability, cancer, and death.  A one-time screening for abdominal aortic aneurysm (AAA) and surgical repair of large AAAs by sound wave imaging (ultrasonography) is recommended for ages 32 to 66 years who are current or former smokers.  Healthy men should no longer receive prostate-specific antigen (PSA) blood tests as part of routine cancer screening. Consult with your caregiver about prostate cancer screening.  Testicular cancer screening is not recommended for adult males who have no symptoms. Screening includes self-exam, caregiver exam, and other screening tests. Consult with your caregiver about any symptoms you have or any concerns you have about testicular cancer.  Use sunscreen with skin protection factor (SPF) of 30 or more. Apply sunscreen liberally and repeatedly throughout the day. You should seek shade when your shadow is shorter than you. Protect yourself by wearing long sleeves, pants, a wide-brimmed hat, and sunglasses year round, whenever you are outdoors.  Once a month, do a whole body skin exam, using a mirror to look at the  skin on your back. Notify your caregiver of new moles, moles that have irregular borders, moles that are  larger than a pencil eraser, or moles that have changed in shape or color.  Stay current with required immunizations.  Influenza. You need a dose every fall (or winter). The composition of the flu vaccine changes each year, so being vaccinated once is not enough.  Pneumococcal polysaccharide. You need 1 to 2 doses if you smoke cigarettes or if you have certain chronic medical conditions. You need 1 dose at age 6 (or older) if you have never been vaccinated.  Tetanus, diphtheria, pertussis (Tdap, Td). Get 1 dose of Tdap vaccine if you are younger than age 87 years, are over 70 and have contact with an infant, are a Research scientist (physical sciences), or simply want to be protected from whooping cough. After that, you need a Td booster dose every 10 years. Consult your caregiver if you have not had at least 3 tetanus and diphtheria-containing shots sometime in your life or have a deep or dirty wound.  HPV. This vaccine is recommended for males 13 through 58 years of age. This vaccine may be given to men 22 through 58 years of age who have not completed the 3 dose series. It is recommended for men through age 75 who have sex with men or whose immune system is weakened because of HIV infection, other illness, or medications. The vaccine is given in 3 doses over 6 months.  Measles, mumps, rubella (MMR). You need at least 1 dose of MMR if you were born in 1957 or later. You may also need a 2nd dose.  Meningococcal. If you are age 75 to 52 years and a Orthoptist living in a residence hall, or have one of several medical conditions, you need to get vaccinated against meningococcal disease. You may also need additional booster doses.  Zoster (shingles). If you are age 109 years or older, you should get this vaccine.  Varicella (chickenpox). If you have never had chickenpox or you were vaccinated but  received only 1 dose, talk to your caregiver to find out if you need this vaccine.  Hepatitis A. You need this vaccine if you have a specific risk factor for hepatitis A virus infection, or you simply wish to be protected from this disease. The vaccine is usually given as 2 doses, 6 to 18 months apart.  Hepatitis B. You need this vaccine if you have a specific risk factor for hepatitis B virus infection or you simply wish to be protected from this disease. The vaccine is given in 3 doses, usually over 6 months. Preventative Service / Frequency Ages 84 to 66  Blood pressure check.** / Every 1 to 2 years.  Lipid and cholesterol check.** / Every 5 years beginning at age 21.  Hepatitis C blood test.** / For any individual with known risks for hepatitis C.  Skin self-exam. / Monthly.  Influenza immunization.** / Every year.  Pneumococcal polysaccharide immunization.** / 1 to 2 doses if you smoke cigarettes or if you have certain chronic medical conditions.  Tetanus, diphtheria, pertussis (Tdap,Td) immunization. / A one-time dose of Tdap vaccine. After that, you need a Td booster dose every 10 years.  HPV immunization. / 3 doses over 6 months, if 26 and younger.  Measles, mumps, rubella (MMR) immunization. / You need at least 1 dose of MMR if you were born in 1957 or later. You may also need a 2nd dose.  Meningococcal immunization. / 1 dose if you are age 24 to 31 years and a Orthoptist living in a residence  hall, or have one of several medical conditions, you need to get vaccinated against meningococcal disease. You may also need additional booster doses.  Varicella immunization.** / Consult your caregiver.  Hepatitis A immunization.** / Consult your caregiver. 2 doses, 6 to 18 months apart.  Hepatitis B immunization.** / Consult your caregiver. 3 doses usually over 6 months. Ages 45 to 72  Blood pressure check.** / Every 1 to 2 years.  Lipid and cholesterol check.** /  Every 5 years beginning at age 6.  Fecal occult blood test (FOBT) of stool. / Every year beginning at age 39 and continuing until age 72. You may not have to do this test if you get colonoscopy every 10 years.  Flexible sigmoidoscopy** or colonoscopy.** / Every 5 years for a flexible sigmoidoscopy or every 10 years for a colonoscopy beginning at age 23 and continuing until age 19.  Hepatitis C blood test.** / For all people born from [birth year] through 1967 and any individual with known risks for hepatitis C.  Skin self-exam. / Monthly.  Influenza immunization.** / Every year.  Pneumococcal polysaccharide immunization.** / 1 to 2 doses if you smoke cigarettes or if you have certain chronic medical conditions.  Tetanus, diphtheria, pertussis (Tdap/Td) immunization.** / A one-time dose of Tdap vaccine. After that, you need a Td booster dose every 10 years.  Measles, mumps, rubella (MMR) immunization. / You need at least 1 dose of MMR if you were born in 1957 or later. You may also need a 2nd dose.  Varicella immunization.**/ Consult your caregiver.  Meningococcal immunization.** / Consult your caregiver.  Hepatitis A immunization.** / Consult your caregiver. 2 doses, 6 to 18 months apart.  Hepatitis B immunization.** / Consult your caregiver. 3 doses, usually over 6 months. Ages 12 and over  Blood pressure check.** / Every 1 to 2 years.  Lipid and cholesterol check.**/ Every 5 years beginning at age 10.  Fecal occult blood test (FOBT) of stool. / Every year beginning at age 50 and continuing until age 28. You may not have to do this test if you get colonoscopy every 10 years.  Flexible sigmoidoscopy** or colonoscopy.** / Every 5 years for a flexible sigmoidoscopy or every 10 years for a colonoscopy beginning at age 57 and continuing until age 53.  Hepatitis C blood test.** / For all people born from [birth year] through 1967 and any individual with known risks for hepatitis C.  Abdominal  aortic aneurysm (AAA) screening.** / A one-time screening for ages 33 to 11 years who are current or former smokers.  Skin self-exam. / Monthly.  Influenza immunization.** / Every year.  Pneumococcal polysaccharide immunization.** / 1 dose at age 79 (or older) if you have never been vaccinated.  Tetanus, diphtheria, pertussis (Tdap, Td) immunization. / A one-time dose of Tdap vaccine if you are over 65 and have contact with an infant, are a Research scientist (physical sciences), or simply want to be protected from whooping cough. After that, you need a Td booster dose every 10 years.  Varicella immunization. ** / Consult your caregiver.  Meningococcal immunization.** / Consult your caregiver.  Hepatitis A immunization. ** / Consult your caregiver. 2 doses, 6 to 18 months apart.  Hepatitis B immunization.** / Check with your caregiver. 3 doses, usually over 6 months. **Family history and personal history of risk and conditions may change your caregiver's recommendations. Document Released: 12/19/2001 Document Revised: 01/15/2012 Document Reviewed: 03/20/2011 [facility] Patient Information 2013 [place], [place].

## 2012-11-04 NOTE — Progress Notes (Signed)
Chief Complaint  Patient presents with  . Annual Exam  . Hypertension  . Hypothyroidism    HPI: Patient comes in today for Preventive Health Care visit  Since his last checkup he's done fairly well. Has noticed that his blood pressure is now up more than not in the 150 range not at the 140/90 and below goal. He is taking his verapamil 240+60 S. he has been doing for a number of years. No current chest pain shortness of breath Asthma reactive airway disease no flares using the Symbicort the Proventil as needed not needed very much.  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 Tries deep healthy but could do better Gets occasional low back pain with prolonged sitting.  Neg tad  ROS:  GEN/ HEENT: No fever, significant weight changes sweats headaches vision problems hearing changes, CV/ PULM; No chest pain shortness of breath cough, syncope,edema  change in exercise tolerance. GI /GU: No adominal pain, vomiting, change in bowel habits. No blood in the stool. No significant GU symptoms. SKIN/HEME: ,no acute skin rashes suspicious lesions or bleeding. No lymphadenopathy, nodules, masses.  NEURO/ PSYCH:  No neurologic signs such as weakness numbness. No depression anxiety. IMM/ Allergy: No unusual infections.  Allergy .   REST of 12 system review negative except as per HPI   Past Medical History  Diagnosis Date  . Hypothyroidism     s/p hyperthyroid heart failure and RAI rx  . Low HDL (under 40)   . Cardiomyopathy     related to hyperthyroid condition  . Asthma     ?  . Umbilical hernia     surgery consult no intervention if no sx   . Allergic rhinitis     Family History  Problem Relation Age of Onset  . Stroke Mother   . Stomach cancer Father   . Diabetes    . Asthma      sibs  . Alcohol abuse      sib    History   Social History  . Marital Status:  Married    Spouse Name: N/A    Number of Children: N/A  . Years of Education: N/A   Social History Main Topics  . Smoking status: Never Smoker   . Smokeless tobacco: None  . Alcohol Use: No  . Drug Use: No  . Sexually Active: None   Other Topics Concern  . 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 Long Hours. 50 hours per week.Does physical loading.  [employer]     Outpatient Encounter Prescriptions as of 11/04/2012  Medication Sig Dispense Refill  . albuterol (PROAIR HFA) 108 (90 BASE) MCG/ACT inhaler 1-2 puffs every 6 hours as needed for asthma  1 Inhaler  1  . aspirin 325 MG tablet Take 325 mg by mouth daily.        Marland Kitchen SYNTHROID 112 MCG tablet TAKE 1 TABLET BY MOUTH EVERY DAY  30 tablet  11  . verapamil (CALAN-SR) 120 MG CR tablet TAKE 1/2 TABLET BY MOUTH EVERY DAY  15 tablet  0  . verapamil (CALAN-SR) 240 MG CR tablet TAKE 1 TABLET BY MOUTH AT BEDTIME  30 tablet  1  . budesonide-formoterol (SYMBICORT) 160-4.5 MCG/ACT inhaler Inhale 2 puffs into the lungs  2 (two) times daily. Prn        . fluticasone (FLONASE) 50 MCG/ACT nasal spray Place 2 sprays into the nose daily. Prn      . lisinopril (PRINIVIL,ZESTRIL) 20 MG tablet Take 1 tablet (20 mg total) by mouth daily.  90 tablet  1  . [DISCONTINUED] verapamil (CALAN-SR) 240 MG CR tablet TAKE 1 TABLET BY MOUTH AT BEDTIME  30 tablet  1    EXAM:  BP 160/88  Pulse 48  Temp 98.3 F (36.8 C) (Oral)  Ht 6\' 1"  (1.854 m)  Wt 200 lb (90.719 kg)  BMI 26.39 kg/m2  SpO2 98%  Body mass index is 26.39 kg/(m^2). Blood pressure right arm 150/98 Physical Exam: Vital signs reviewed WUJ:WJXB is a well-developed well-nourished alert cooperative  AA  male who appears  stated age in no acute distress.  HEENT: normocephalic atraumatic , Eyes: PERRL EOM's full, conjunctiva clear, Nares: paten,t no deformity discharge or tenderness., Ears: no deformity  EAC's clear TMs with normal landmarks. Mouth: clear OP, no lesions, edema.  Moist mucous membranes. Dentition in adequate repair. NECK: supple without masses, thyromegaly or bruits. CHEST/PULM:  Clear to auscultation and percussion breath sounds equal no wheeze , rales or rhonchi. No chest wall deformities or tenderness. CV: PMI is nondisplaced, S1 S2 no gallops, murmurs, rubs. Peripheral pulses are full without delay.No JVD .  ABDOMEN: Bowel sounds normal nontender  No guard or rebound, no hepato splenomegal no CVA tenderness.  No hernia. Extremtities:  No clubbing cyanosis or edema, no acute joint swelling or redness no focal atrophy NEURO:  Oriented x3, cranial nerves 3-12 appear to be intact, no obvious focal weakness,gait within normal limits no abnormal reflexes or asymmetrical DTRs appear equal no clonus SKIN: No acute rashes normal turgor, color, no bruising or petechiae. PSYCH: Oriented, good eye contact, no obvious depression anxiety, cognition and judgment appear normal. LN: no cervical axillary inguinal adenopathy Decline prostate exam today  No sx   Lab Results  Component Value Date   WBC 5.8 10/28/2012   HGB 14.4 10/28/2012   HCT 43.7 10/28/2012   PLT 171.0 10/28/2012   GLUCOSE 112* 10/28/2012   CHOL 159 10/28/2012   TRIG 152.0* 10/28/2012   HDL 28.90* 10/28/2012   LDLCALC 100* 10/28/2012   ALT 19 10/28/2012   AST 22 10/28/2012   NA 139 10/28/2012   K 3.7 10/28/2012   CL 105 10/28/2012   CREATININE 0.9 10/28/2012   BUN 10 10/28/2012   CO2 27 10/28/2012   TSH 2.54 10/28/2012   PSA 0.56 10/28/2012    ASSESSMENT AND PLAN:  Discussed the following assessment and plan:  1. Visit for preventive health examination    flu vaccine today utd on colon  2. HYPERTENSION    add aceI risk benefit and fu in about 6 weeks   3. Need for prophylactic vaccination and inoculation against influenza   4. HYPERLIPIDEMIA    low hdl consider trial a different statin  5.  HYPOTHYROIDISM   6. Hyperglycemia   7. ASTHMA    quiescent  8. Umbilical hernia    no sx   9. Low HDL (under 40)   10. Fasting hyperglycemia    check hg a1c at next labs  may do this when recheck bmp and ht.    Patient Care Team: Madelin Headings, MD as PCP - General Patient Instructions  Your blood pressure is elevated today confirming what you have at home. We will add another medicine to  take once a day in addition to your verapamil and followup in about 6 weeks or so. We may repeat blood work at that time Your blood sugar fasting is a bit elevated in the prediabetic range avoid simple sugars try to get some aerobic exercise.  If her pulse is persistently under 45 we may have to adjust the verapamil dose. The initial pulse was 48 on repeat was 60.   Fat and Cholesterol Control Diet Cholesterol levels in your body are determined significantly by your diet. Cholesterol levels may also be related to heart disease. The following material helps to explain this relationship and discusses what you can do to help keep your heart healthy. Not all cholesterol is bad. Low-density lipoprotein (LDL) cholesterol is the "bad" cholesterol. It may cause fatty deposits to build up inside your arteries. High-density lipoprotein (HDL) cholesterol is "good." It helps to remove the "bad" LDL cholesterol from your blood. Cholesterol is a very important risk factor for heart disease. Other risk factors are high blood pressure, smoking, stress, heredity, and weight. The heart muscle gets its supply of blood through the coronary arteries. If your LDL cholesterol is high and your HDL cholesterol is low, you are at risk for having fatty deposits build up in your coronary arteries. This leaves less room through which blood can flow. Without sufficient blood and oxygen, the heart muscle cannot function properly and you may feel chest pains (angina pectoris). When a coronary artery closes up entirely, a part of the heart  muscle may die causing a heart attack (myocardial infarction). CHECKING CHOLESTEROL When your caregiver sends your blood to a lab to be examined for cholesterol, a complete lipid (fat) profile may be done. With this test, the total amount of cholesterol and levels of LDL and HDL are determined. Triglycerides are a type of fat that circulates in the blood. They can also be used to determine heart disease risk. The list below describes what the numbers should be: Test: Total Cholesterol.  Less than 200 mg/dl. Test: LDL "bad cholesterol."  Less than 100 mg/dl.  Less than 70 mg/dl if you are at very high risk of a heart attack or sudden cardiac death. Test: HDL "good cholesterol."  Greater than 50 mg/dl for women.  Greater than 40 mg/dl for men. Test: Triglycerides.  Less than 150 mg/dl. CONTROLLING CHOLESTEROL WITH DIET Although exercise and lifestyle factors are important, your diet is key. That is because certain foods are known to raise cholesterol and others to lower it. The goal is to balance foods for their effect on cholesterol and more importantly, to replace saturated and trans fat with other types of fat, such as monounsaturated fat, polyunsaturated fat, and omega-3 fatty acids. On average, a person should consume no more than 15 to 17 g of saturated fat daily. Saturated and trans fats are considered "bad" fats, and they will raise LDL cholesterol. Saturated fats are primarily found in animal products such as meats, butter, and cream. However, that does not mean you need to give up all your favorite foods. Today, there are good tasting, low-fat, low-cholesterol substitutes for most of the things you like to eat. Choose low-fat or nonfat alternatives. Choose round or loin cuts of red meat. These types of cuts are lowest in fat and cholesterol. Chicken (without the skin), fish, veal, and ground [place] breast are great choices. Eliminate fatty meats, such as hot dogs and salami. Even  shellfish have little or no saturated fat. Have a 3 oz (  85 g) portion when you eat lean meat, poultry, or fish. Trans fats are also called "partially hydrogenated oils." They are oils that have been scientifically manipulated so that they are solid at room temperature resulting in a longer shelf life and improved taste and texture of foods in which they are added. Trans fats are found in stick margarine, some tub margarines, cookies, crackers, and baked goods.  When baking and cooking, oils are a great substitute for butter. The monounsaturated oils are especially beneficial since it is believed they lower LDL and raise HDL. The oils you should avoid entirely are saturated tropical oils, such as coconut and palm.  Remember to eat a lot from food groups that are naturally free of saturated and trans fat, including fish, fruit, vegetables, beans, grains (barley, rice, couscous, bulgur wheat), and pasta (without cream sauces).  IDENTIFYING FOODS THAT LOWER CHOLESTEROL  Soluble fiber may lower your cholesterol. This type of fiber is found in fruits such as apples, vegetables such as broccoli, potatoes, and carrots, legumes such as beans, peas, and lentils, and grains such as barley. Foods fortified with plant sterols (phytosterol) may also lower cholesterol. You should eat at least 2 g per day of these foods for a cholesterol lowering effect.  Read package labels to identify low-saturated fats, trans fat free, and low-fat foods at the supermarket. Select cheeses that have only 2 to 3 g saturated fat per ounce. Use a heart-healthy tub margarine that is free of trans fats or partially hydrogenated oil. When buying baked goods (cookies, crackers), avoid partially hydrogenated oils. Breads and muffins should be made from whole grains (whole-wheat or whole oat flour, instead of "flour" or "enriched flour"). Buy non-creamy canned soups with reduced salt and no added fats.  FOOD PREPARATION TECHNIQUES  Never deep-fry.  If you must fry, either stir-fry, which uses very little fat, or use non-stick cooking sprays. When possible, broil, bake, or roast meats, and steam vegetables. Instead of putting butter or margarine on vegetables, use lemon and herbs, applesauce, and cinnamon (for squash and sweet potatoes), nonfat yogurt, salsa, and low-fat dressings for salads.  LOW-SATURATED FAT / LOW-FAT FOOD SUBSTITUTES Meats / Saturated Fat (g)  Avoid: Steak, marbled (3 oz/85 g) / 11 g  Choose: Steak, lean (3 oz/85 g) / 4 g  Avoid: Hamburger (3 oz/85 g) / 7 g  Choose: Hamburger, lean (3 oz/85 g) / 5 g  Avoid: Ham (3 oz/85 g) / 6 g  Choose: Ham, lean cut (3 oz/85 g) / 2.4 g  Avoid: Chicken, with skin, dark meat (3 oz/85 g) / 4 g  Choose: Chicken, skin removed, dark meat (3 oz/85 g) / 2 g  Avoid: Chicken, with skin, light meat (3 oz/85 g) / 2.5 g  Choose: Chicken, skin removed, light meat (3 oz/85 g) / 1 g Dairy / Saturated Fat (g)  Avoid: Whole milk (1 cup) / 5 g  Choose: Low-fat milk, 2% (1 cup) / 3 g  Choose: Low-fat milk, 1% (1 cup) / 1.5 g  Choose: Skim milk (1 cup) / 0.3 g  Avoid: Hard cheese (1 oz/28 g) / 6 g  Choose: Skim milk cheese (1 oz/28 g) / 2 to 3 g  Avoid: Cottage cheese, 4% fat (1 cup) / 6.5 g  Choose: Low-fat cottage cheese, 1% fat (1 cup) / 1.5 g  Avoid: Ice cream (1 cup) / 9 g  Choose: Sherbet (1 cup) / 2.5 g  Choose: Nonfat frozen yogurt (1 cup) /  0.3 g  Choose: Frozen fruit bar / trace  Avoid: Whipped cream (1 tbs) / 3.5 g  Choose: Nondairy whipped topping (1 tbs) / 1 g Condiments / Saturated Fat (g)  Avoid: Mayonnaise (1 tbs) / 2 g  Choose: Low-fat mayonnaise (1 tbs) / 1 g  Avoid: Butter (1 tbs) / 7 g  Choose: Extra light margarine (1 tbs) / 1 g  Avoid: Coconut oil (1 tbs) / 11.8 g  Choose: Olive oil (1 tbs) / 1.8 g  Choose: Corn oil (1 tbs) / 1.7 g  Choose: Safflower oil (1 tbs) / 1.2 g  Choose: Sunflower oil (1 tbs) / 1.4 g  Choose: Soybean oil (1  tbs) / 2.4 g  Choose: Canola oil (1 tbs) / 1 g Document Released: 10/23/2005 Document Revised: 01/15/2012 Document Reviewed: 04/13/2011 [employer] Patient Information 2013 [place], [place].   Preventive Care for Adults, Male A healthy lifestyle and preventive care can promote health and wellness. Preventive health guidelines for men include the following key practices:  A routine yearly physical is a good way to check with your caregiver about your health and preventative screening. It is a chance to share any concerns and updates on your health, and to receive a thorough exam.  Visit your dentist for a routine exam and preventative care every 6 months. Brush your teeth twice a day and floss once a day. Good oral hygiene prevents tooth decay and gum disease.  The frequency of eye exams is based on your age, health, family medical history, use of contact lenses, and other factors. Follow your caregiver's recommendations for frequency of eye exams.  Eat a healthy diet. Foods like vegetables, fruits, whole grains, low-fat dairy products, and lean protein foods contain the nutrients you need without too many calories. Decrease your intake of foods high in solid fats, added sugars, and salt. Eat the right amount of calories for you.Get information about a proper diet from your caregiver, if necessary.  Regular physical exercise is one of the most important things you can do for your health. Most adults should get at least 150 minutes of moderate-intensity exercise (any activity that increases your heart rate and causes you to sweat) each week. In addition, most adults need muscle-strengthening exercises on 2 or more days a week.  Maintain a healthy weight. The body mass index (BMI) is a screening tool to identify possible weight problems. It provides an estimate of body fat based on height and weight. Your caregiver can help determine your BMI, and can help you achieve or maintain a healthy weight.For  adults 20 years and older:  A BMI below 18.5 is considered underweight.  A BMI of 18.5 to 24.9 is normal.  A BMI of 25 to 29.9 is considered overweight.  A BMI of 30 and above is considered obese.  Maintain normal blood lipids and cholesterol levels by exercising and minimizing your intake of saturated fat. Eat a balanced diet with plenty of fruit and vegetables. Blood tests for lipids and cholesterol should begin at age 52 and be repeated every 5 years. If your lipid or cholesterol levels are high, you are over 50, or you are a high risk for heart disease, you may need your cholesterol levels checked more frequently.Ongoing high lipid and cholesterol levels should be treated with medicines if diet and exercise are not effective.  If you smoke, find out from your caregiver how to quit. If you do not use tobacco, do not start.  If you choose  to drink alcohol, do not exceed 2 drinks per day. One drink is considered to be 12 ounces (355 mL) of beer, 5 ounces (148 mL) of wine, or 1.5 ounces (44 mL) of liquor.  Avoid use of street drugs. Do not share needles with anyone. Ask for help if you need support or instructions about stopping the use of drugs.  High blood pressure causes heart disease and increases the risk of stroke. Your blood pressure should be checked at least every 1 to 2 years. Ongoing high blood pressure should be treated with medicines, if weight loss and exercise are not effective.  If you are 16 to 58 years old, ask your caregiver if you should take aspirin to prevent heart disease.  Diabetes screening involves taking a blood sample to check your fasting blood sugar level. This should be done once every 3 years, after age 36, if you are within normal weight and without risk factors for diabetes. Testing should be considered at a younger age or be carried out more frequently if you are overweight and have at least 1 risk factor for diabetes.  Colorectal cancer can be detected and  often prevented. Most routine colorectal cancer screening begins at the age of 64 and continues through age 9. However, your caregiver may recommend screening at an earlier age if you have risk factors for colon cancer. On a yearly basis, your caregiver may provide home test kits to check for hidden blood in the stool. Use of a small camera at the end of a tube, to directly examine the colon (sigmoidoscopy or colonoscopy), can detect the earliest forms of colorectal cancer. Talk to your caregiver about this at age 17, when routine screening begins. Direct examination of the colon should be repeated every 5 to 10 years through age 66, unless early forms of pre-cancerous polyps or small growths are found.  Hepatitis C blood testing is recommended for all people born from [birth year] through 1967 and any individual with known risks for hepatitis C.  Practice safe sex. Use condoms and avoid high-risk sexual practices to reduce the spread of sexually transmitted infections (STIs). STIs include gonorrhea, chlamydia, syphilis, trichomonas, herpes, HPV, and human immunodeficiency virus (HIV). Herpes, HIV, and HPV are viral illnesses that have no cure. They can result in disability, cancer, and death.  A one-time screening for abdominal aortic aneurysm (AAA) and surgical repair of large AAAs by sound wave imaging (ultrasonography) is recommended for ages 64 to 12 years who are current or former smokers.  Healthy men should no longer receive prostate-specific antigen (PSA) blood tests as part of routine cancer screening. Consult with your caregiver about prostate cancer screening.  Testicular cancer screening is not recommended for adult males who have no symptoms. Screening includes self-exam, caregiver exam, and other screening tests. Consult with your caregiver about any symptoms you have or any concerns you have about testicular cancer.  Use sunscreen with skin protection factor (SPF) of 30 or more. Apply sunscreen  liberally and repeatedly throughout the day. You should seek shade when your shadow is shorter than you. Protect yourself by wearing long sleeves, pants, a wide-brimmed hat, and sunglasses year round, whenever you are outdoors.  Once a month, do a whole body skin exam, using a mirror to look at the skin on your back. Notify your caregiver of new moles, moles that have irregular borders, moles that are larger than a pencil eraser, or moles that have changed in shape or color.  Stay current with  required immunizations.  Influenza. You need a dose every fall (or winter). The composition of the flu vaccine changes each year, so being vaccinated once is not enough.  Pneumococcal polysaccharide. You need 1 to 2 doses if you smoke cigarettes or if you have certain chronic medical conditions. You need 1 dose at age 63 (or older) if you have never been vaccinated.  Tetanus, diphtheria, pertussis (Tdap, Td). Get 1 dose of Tdap vaccine if you are younger than age 67 years, are over 75 and have contact with an infant, are a Research scientist (physical sciences), or simply want to be protected from whooping cough. After that, you need a Td booster dose every 10 years. Consult your caregiver if you have not had at least 3 tetanus and diphtheria-containing shots sometime in your life or have a deep or dirty wound.  HPV. This vaccine is recommended for males 13 through 58 years of age. This vaccine may be given to men 22 through 58 years of age who have not completed the 3 dose series. It is recommended for men through age 51 who have sex with men or whose immune system is weakened because of HIV infection, other illness, or medications. The vaccine is given in 3 doses over 6 months.  Measles, mumps, rubella (MMR). You need at least 1 dose of MMR if you were born in 1957 or later. You may also need a 2nd dose.  Meningococcal. If you are age 25 to 73 years and a Orthoptist living in a residence hall, or have one of  several medical conditions, you need to get vaccinated against meningococcal disease. You may also need additional booster doses.  Zoster (shingles). If you are age 22 years or older, you should get this vaccine.  Varicella (chickenpox). If you have never had chickenpox or you were vaccinated but received only 1 dose, talk to your caregiver to find out if you need this vaccine.  Hepatitis A. You need this vaccine if you have a specific risk factor for hepatitis A virus infection, or you simply wish to be protected from this disease. The vaccine is usually given as 2 doses, 6 to 18 months apart.  Hepatitis B. You need this vaccine if you have a specific risk factor for hepatitis B virus infection or you simply wish to be protected from this disease. The vaccine is given in 3 doses, usually over 6 months. Preventative Service / Frequency Ages 54 to 58  Blood pressure check.** / Every 1 to 2 years.  Lipid and cholesterol check.** / Every 5 years beginning at age 27.  Hepatitis C blood test.** / For any individual with known risks for hepatitis C.  Skin self-exam. / Monthly.  Influenza immunization.** / Every year.  Pneumococcal polysaccharide immunization.** / 1 to 2 doses if you smoke cigarettes or if you have certain chronic medical conditions.  Tetanus, diphtheria, pertussis (Tdap,Td) immunization. / A one-time dose of Tdap vaccine. After that, you need a Td booster dose every 10 years.  HPV immunization. / 3 doses over 6 months, if 26 and younger.  Measles, mumps, rubella (MMR) immunization. / You need at least 1 dose of MMR if you were born in 1957 or later. You may also need a 2nd dose.  Meningococcal immunization. / 1 dose if you are age 78 to 26 years and a Orthoptist living in a residence hall, or have one of several medical conditions, you need to get vaccinated against meningococcal disease. You may  also need additional booster doses.  Varicella immunization.**  / Consult your caregiver.  Hepatitis A immunization.** / Consult your caregiver. 2 doses, 6 to 18 months apart.  Hepatitis B immunization.** / Consult your caregiver. 3 doses usually over 6 months. Ages 54 to 25  Blood pressure check.** / Every 1 to 2 years.  Lipid and cholesterol check.** / Every 5 years beginning at age 18.  Fecal occult blood test (FOBT) of stool. / Every year beginning at age 20 and continuing until age 9. You may not have to do this test if you get colonoscopy every 10 years.  Flexible sigmoidoscopy** or colonoscopy.** / Every 5 years for a flexible sigmoidoscopy or every 10 years for a colonoscopy beginning at age 58 and continuing until age 48.  Hepatitis C blood test.** / For all people born from [birth year] through 1967 and any individual with known risks for hepatitis C.  Skin self-exam. / Monthly.  Influenza immunization.** / Every year.  Pneumococcal polysaccharide immunization.** / 1 to 2 doses if you smoke cigarettes or if you have certain chronic medical conditions.  Tetanus, diphtheria, pertussis (Tdap/Td) immunization.** / A one-time dose of Tdap vaccine. After that, you need a Td booster dose every 10 years.  Measles, mumps, rubella (MMR) immunization. / You need at least 1 dose of MMR if you were born in 1957 or later. You may also need a 2nd dose.  Varicella immunization.**/ Consult your caregiver.  Meningococcal immunization.** / Consult your caregiver.  Hepatitis A immunization.** / Consult your caregiver. 2 doses, 6 to 18 months apart.  Hepatitis B immunization.** / Consult your caregiver. 3 doses, usually over 6 months. Ages 13 and over  Blood pressure check.** / Every 1 to 2 years.  Lipid and cholesterol check.**/ Every 5 years beginning at age 58.  Fecal occult blood test (FOBT) of stool. / Every year beginning at age 81 and continuing until age 60. You may not have to do this test if you get colonoscopy every 10 years.  Flexible  sigmoidoscopy** or colonoscopy.** / Every 5 years for a flexible sigmoidoscopy or every 10 years for a colonoscopy beginning at age 60 and continuing until age 51.  Hepatitis C blood test.** / For all people born from [birth year] through 1967 and any individual with known risks for hepatitis C.  Abdominal aortic aneurysm (AAA) screening.** / A one-time screening for ages 24 to 38 years who are current or former smokers.  Skin self-exam. / Monthly.  Influenza immunization.** / Every year.  Pneumococcal polysaccharide immunization.** / 1 dose at age 24 (or older) if you have never been vaccinated.  Tetanus, diphtheria, pertussis (Tdap, Td) immunization. / A one-time dose of Tdap vaccine if you are over 65 and have contact with an infant, are a Research scientist (physical sciences), or simply want to be protected from whooping cough. After that, you need a Td booster dose every 10 years.  Varicella immunization. ** / Consult your caregiver.  Meningococcal immunization.** / Consult your caregiver.  Hepatitis A immunization. ** / Consult your caregiver. 2 doses, 6 to 18 months apart.  Hepatitis B immunization.** / Check with your caregiver. 3 doses, usually over 6 months. **Family history and personal history of risk and conditions may change your caregiver's recommendations. Document Released: 12/19/2001 Document Revised: 01/15/2012 Document Reviewed: 03/20/2011 [facility] Patient Information 2013 [place], [place].     Neta Mends. Panosh M.D.

## 2012-11-07 ENCOUNTER — Other Ambulatory Visit: Payer: Self-pay | Admitting: Internal Medicine

## 2012-12-06 ENCOUNTER — Other Ambulatory Visit: Payer: Self-pay | Admitting: Internal Medicine

## 2012-12-16 ENCOUNTER — Ambulatory Visit: Payer: 59 | Admitting: Internal Medicine

## 2012-12-23 ENCOUNTER — Ambulatory Visit: Payer: 59 | Admitting: Internal Medicine

## 2012-12-23 DIAGNOSIS — Z0289 Encounter for other administrative examinations: Secondary | ICD-10-CM

## 2013-01-07 ENCOUNTER — Other Ambulatory Visit: Payer: Self-pay | Admitting: Internal Medicine

## 2013-01-09 ENCOUNTER — Other Ambulatory Visit: Payer: Self-pay | Admitting: Family Medicine

## 2013-01-09 MED ORDER — VERAPAMIL HCL ER 120 MG PO TBCR: EXTENDED_RELEASE_TABLET | ORAL | Status: DC

## 2013-01-09 MED ORDER — VERAPAMIL HCL ER 240 MG PO TBCR: EXTENDED_RELEASE_TABLET | ORAL | Status: DC

## 2013-01-13 ENCOUNTER — Other Ambulatory Visit: Payer: Self-pay | Admitting: Family Medicine

## 2013-02-17 ENCOUNTER — Other Ambulatory Visit: Payer: Self-pay | Admitting: Internal Medicine

## 2013-04-02 ENCOUNTER — Other Ambulatory Visit: Payer: Self-pay | Admitting: Internal Medicine

## 2013-04-08 ENCOUNTER — Other Ambulatory Visit: Payer: Self-pay | Admitting: Internal Medicine

## 2013-05-08 ENCOUNTER — Other Ambulatory Visit: Payer: Self-pay | Admitting: Internal Medicine

## 2013-05-13 NOTE — Telephone Encounter (Signed)
Pt should have returned in Feb.  Please advise.  Thanks!!

## 2013-05-14 NOTE — Telephone Encounter (Signed)
Have him make appt then  Refill med enough ot get to that time. Have him bring in a monitor if has a home BP monitor so we can check in office.

## 2013-05-14 NOTE — Telephone Encounter (Signed)
Pt is sch for 7-14 for med fup. Please refill verapamil 240 mg and 120 mg call [facility]

## 2013-05-15 ENCOUNTER — Ambulatory Visit: Payer: 59 | Admitting: Internal Medicine

## 2013-05-19 ENCOUNTER — Ambulatory Visit (INDEPENDENT_AMBULATORY_CARE_PROVIDER_SITE_OTHER): Payer: 59 | Admitting: Internal Medicine

## 2013-05-19 ENCOUNTER — Encounter: Payer: Self-pay | Admitting: Internal Medicine

## 2013-05-19 VITALS — BP 130/90 | HR 52 | Temp 98.0°F | Wt 199.0 lb

## 2013-05-19 DIAGNOSIS — R5381 Other malaise: Secondary | ICD-10-CM

## 2013-05-19 DIAGNOSIS — R7309 Other abnormal glucose: Secondary | ICD-10-CM

## 2013-05-19 DIAGNOSIS — R002 Palpitations: Secondary | ICD-10-CM | POA: Insufficient documentation

## 2013-05-19 DIAGNOSIS — R739 Hyperglycemia, unspecified: Secondary | ICD-10-CM

## 2013-05-19 DIAGNOSIS — I10 Essential (primary) hypertension: Secondary | ICD-10-CM

## 2013-05-19 DIAGNOSIS — R5383 Other fatigue: Secondary | ICD-10-CM

## 2013-05-19 DIAGNOSIS — E039 Hypothyroidism, unspecified: Secondary | ICD-10-CM

## 2013-05-19 HISTORY — DX: Palpitations: R00.2

## 2013-05-19 MED ORDER — VERAPAMIL HCL ER 240 MG PO TBCR: EXTENDED_RELEASE_TABLET | ORAL | Status: DC

## 2013-05-19 MED ORDER — VERAPAMIL HCL ER 120 MG PO TBCR: EXTENDED_RELEASE_TABLET | ORAL | Status: DC

## 2013-05-19 NOTE — Patient Instructions (Signed)
You will be contacted about cardiology referral consult.  It is possible that we need to decrease the verapamil and add some other medication.   May need holter  or event monitor.       Contact us in meantime if having problems   Otherwise   December cpx with labs   Blood sugar is good today.

## 2013-05-19 NOTE — Progress Notes (Signed)
Chief Complaint  Patient presents with  . Follow-up  . Hypertension  . Palpitations    HPI: Patient comes in today request he was due to come in 3-4 months ago for followup of his hypertension management. He is running out of medicine and was given 5 pills recently.  He describes occasional palpitations or skipped beats and during these episodes the last anywhere from 30 minutes to 2 days he feels extra tired and decreased appetite.  No racing heart no change in his thyroid medicine no change in his blood pressure medicine he is on a somewhat high dose of verapamil 300 mg a day. He's been on this for quite a while. He try the lisinopril given to him last time but after about 6 weeks noted that it can make a difference in his blood pressure and he states it made him drowsy because he drives he stopped it the drowsiness back to baseline.  Heart slipping beats   a t times   he doesn't think it is related to when he takes the medicine specific activity time and today comes out of the blue. Just feels tired. No low blood pressure. Ate small bowl of    Cereal today .  ROS: See pertinent positives and negatives per HPI. No coughing syncope edema asthmatic symptoms are stable no acute wheezing.  Past Medical History  Diagnosis Date  . Hypothyroidism     s/p hyperthyroid heart failure and RAI rx  . Low HDL (under 40)   . Cardiomyopathy     related to hyperthyroid condition  . Asthma     ?  . Umbilical hernia     surgery consult no intervention if no sx   . Allergic rhinitis     Family History  Problem Relation Age of Onset  . Stroke Mother   . Stomach cancer Father   . Diabetes    . Asthma      sibs  . Alcohol abuse      sib    History   Social History  . Marital Status: Married    Spouse Name: N/A    Number of Children: N/A  . Years of Education: N/A   Social History Main Topics  . Smoking status: Never Smoker   . Smokeless tobacco: None  . Alcohol Use: No  . Drug Use:  No  . Sexually Active: None   Other Topics Concern  . 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 Long Hours. 50 hours per week.   Does physical loading.  [employer]       EXAM:  BP 130/90  Pulse 52  Temp(Src) 98 F (36.7 C) (Oral)  Wt 199 lb (90.266 kg)  BMI 26.26 kg/m2  SpO2 98%  Body mass index is 26.26 kg/(m^2).  GENERAL: vitals reviewed and listed above, alert, oriented, appears well hydrated and in no acute distress  HEENT: atraumatic, conjunctiva  clear, no obvious abnormalities on inspection of external nose and ears  NECK: no obvious masses on inspection palpation no adenopathy or JVD LUNGS: clear to auscultation bilaterally, no wheezes, rales or rhonchi, good air movement CV: HRRR, no gallops or murmurs are noted no clubbing cyanosis or  peripheral edema nl cap refill  MS: moves all extremities without noticeable focal  abnormality PSYCH: pleasant and cooperative, no obvious depression or anxiety Lab Results  Component Value Date   WBC 5.8 10/28/2012   HGB 14.4 10/28/2012   HCT 43.7 10/28/2012   PLT 171.0 10/28/2012   GLUCOSE 112* 10/28/2012   CHOL 159 10/28/2012   TRIG 152.0* 10/28/2012   HDL 28.90* 10/28/2012   LDLCALC 100* 10/28/2012   ALT 19 10/28/2012   AST 22 10/28/2012   NA 139 10/28/2012   K 3.7 10/28/2012   CL 105 10/28/2012   CREATININE 0.9 10/28/2012   BUN 10 10/28/2012   CO2 27 10/28/2012   TSH 2.54 10/28/2012   PSA 0.56 10/28/2012   EKG shows sinus bradycardia with no arrhythmia pulse about 55 nonspecific T waves.  ASSESSMENT AND PLAN:  Discussed the following assessment and plan:  HYPERTENSION - No help with lisinopril readings come down on repeat but diastolic still a bit high - Plan: Ambulatory referral to Cardiology  Palpitations - Plan: EKG 12-Lead, Ambulatory referral to Cardiology  Hypothyroidism  Other malaise and fatigue  - Tends to occur with the palpitations - Plan: EKG 12-Lead, Ambulatory referral to Cardiology  Hyperglycemia - Normal blood sugar today - Plan: POCT glucose (manual entry) Uncertain of blood pressure control he is on a high dose of her abdomen which could given bradycardia and activate some of the symptoms but he has been on this for quite a while. One option would be to decrease his dose of verapamil and add another medicine he does not feel the lisinopril was very helpful for him.  I guess we could consider diuretic or other. In regard to his skipped beats or palpitations on a bit concerned because he feels bad when he gets them and describes fatigue. We will do a cardiology referral consult about working up the palpitations and fatigue and optimal control choices for his blood pressure. Will probably need an echo.   Thyroid disease appears to be controlled. Refill medicine today pending cardiology consult. -Patient advised to return or notify health care team  if symptoms worsen or persist or new concerns arise.  Patient Instructions  You will be contacted about cardiology referral consult.  It is possible that we need to decrease the verapamil and add some other medication.   May need holter  or event monitor.       Contact us in meantime if having problems   Otherwise   December cpx with labs   Blood sugar is good today.   Neta Mends. Zarin Knupp M.D.

## 2013-05-23 ENCOUNTER — Telehealth: Payer: Self-pay | Admitting: Internal Medicine

## 2013-06-03 ENCOUNTER — Encounter: Payer: Self-pay | Admitting: Cardiology

## 2013-06-03 ENCOUNTER — Ambulatory Visit (INDEPENDENT_AMBULATORY_CARE_PROVIDER_SITE_OTHER): Payer: 59 | Admitting: Cardiology

## 2013-06-03 VITALS — BP 146/82 | HR 90 | Ht 75.0 in | Wt 200.0 lb

## 2013-06-03 DIAGNOSIS — R002 Palpitations: Secondary | ICD-10-CM

## 2013-06-03 DIAGNOSIS — I10 Essential (primary) hypertension: Secondary | ICD-10-CM

## 2013-06-03 DIAGNOSIS — I4891 Unspecified atrial fibrillation: Secondary | ICD-10-CM

## 2013-06-03 DIAGNOSIS — I4819 Other persistent atrial fibrillation: Secondary | ICD-10-CM

## 2013-06-03 DIAGNOSIS — R0600 Dyspnea, unspecified: Secondary | ICD-10-CM

## 2013-06-03 DIAGNOSIS — R0609 Other forms of dyspnea: Secondary | ICD-10-CM

## 2013-06-03 DIAGNOSIS — R0989 Other specified symptoms and signs involving the circulatory and respiratory systems: Secondary | ICD-10-CM

## 2013-06-03 DIAGNOSIS — R5381 Other malaise: Secondary | ICD-10-CM

## 2013-06-03 DIAGNOSIS — R5383 Other fatigue: Secondary | ICD-10-CM

## 2013-06-03 NOTE — Patient Instructions (Addendum)
As pleasure meeting you today.  It would appear that you have had atrial fibrillation off and on for his 15 years now.  I'm not sure what the episodes are when you have the palpitations and fatigue.  My suggestion is that is probably the atrial fibrillation rate is going fast.  I would take an extra 1/2 dose of the verapamil when this happens.  In order heart ultrasound, 2-D echocardiogram to assess your heart function and chamber sizes to let me figure out if we do any additional treatment of your atrial Fibrillation.  I will see you back in 2 -3 weeks.  In the meantime which I consider that we talked about the long-term blood thinners for stroke prevention.

## 2013-06-04 ENCOUNTER — Encounter: Payer: Self-pay | Admitting: Cardiology

## 2013-06-04 DIAGNOSIS — R0600 Dyspnea, unspecified: Secondary | ICD-10-CM | POA: Insufficient documentation

## 2013-06-04 DIAGNOSIS — R0609 Other forms of dyspnea: Secondary | ICD-10-CM | POA: Insufficient documentation

## 2013-06-04 NOTE — Assessment & Plan Note (Signed)
He does get a little dyspneic on exertion. Would have a fibrillation, telemetry is no diastolic dysfunction potential with LVH or if this is simply because of his atrial fibrillation. Either way would make me want to be more likely to consider an antiarrhythmic.

## 2013-06-04 NOTE — Assessment & Plan Note (Signed)
Not quite adequately trolled. We could potentially increase his verapamil dose in the evening as his heart rate still somewhat fast. The other option would be to add a different blood pressure medication. I want to see what his overall cardiac function looks like prior to doing that.

## 2013-06-04 NOTE — Assessment & Plan Note (Signed)
Not sure to what these symptoms are Coumadin currently defibrillation it could be related to beats or PVCs. When describing sounds like premature beats where it is a pause in a more forceful beat. They'll happen all the time, and I don't know about it and get any on the monitor. If he still having some as a backup I have him wear monitor just to a series A. fib burden is in DC if there is any other arrhythmia or PVCs/PACs.

## 2013-06-04 NOTE — Assessment & Plan Note (Addendum)
Cc relatively well rate controlled currently in atrial fibrillation. He says his knowledge nature fibrillation so is probably paroxysmal albeit persistent in the proximal isn't occur. His CHA2DS2VAsc2 score is only 1 for the hypertension making him intermediate risk with the recommendation of aspirin versus oral anticoagulation.  I don't think she's ever had this conversation and was somewhat taken aback by it. At this point in time I not convinced that or the crit which is necessary, however he does seem to have prolonged episodes of atrial fibrillation that is still paroxysmal. He is at increased risk of stroke with him having recurrences. I think if we did do an evaluation would be with a NOAC. Currently we'll keep him on his aspirin as is.  Plan: Echocardiogram to assess overall function, ventricular and atrial sizes as well as valves. Discuss potential initiation of anti-coagulation plus or minus antiarrhythmic such as flecainide or Multaq addition to his verapamil. He actually may be a good candidate to consider ablation.

## 2013-06-04 NOTE — Assessment & Plan Note (Addendum)
Sure how to T. would go along with his palpitations. Certainly is having a lot of PVCs he may have worsening cardiac output during those times. It may also be that his nose has a fibrillation twice feeling fatigued. Unable to explain it if he is a fibrillation still when I see him back after his echo.

## 2013-06-04 NOTE — Progress Notes (Signed)
Patient ID: James Nunez, male   DOB: [birth year], 57 y.o.   MRN: [id number]  PCP: Lorretta Harp, MD  Clinic Note: Chief Complaint  Patient presents with  . New Evaluation    palp,HTN---- no chest pain, no sob, no edema ,palp occur anytime    HPI: James Nunez is a 57 y.o. male with a PMH below who presents today for evaluation of palpitations and hypertension. In reviewing his EKG his atrial fibrillation at a rate of 90 beats a minute talk about that he says oh yes he has had this for years, at least 15 years. He said the atrial fibrillation was initially noted when he was diagnosed with hyperthyroidism. He was treated with thyroidectomy and is now on Synthroid. He still has episodes of atrial fibrillation but hasn't had that rapid episodes recently. He says that it is intermittent but seem to be somewhat persistently comes. He doesn't really notice it when his only regular rate rate. He does note that has rated it he notices it. Is currently on verapamil for her presumably rate control and blood pressure control. He is on aspirin. Other than that he really has seasonal allergies.  Interval History: I don't really understand the impetus for him being referred besides that he feels fatigue associated with irregular heartbeats. He is in atrial fibrillation quite often, but his does note certain times when it is more significant where he feel a stronger beat at pollicis. When this happens he feels fatigued. He also feels fatigued when his heart rate goes up.  He used to play basketball that time but no longer plays simply because exercise and activity would often doesn't his heart rate to really fast and he would just not real good. Her to sit down and get physically sick. He be unable to catch his breath.  He will he doesn't note any discomfort in his chest except one is heartbeats or irregular. No chest tightness or pressure with rest or exertion. No shortness of breath with rest,  but does get occasionally short of breath with exertion if he is heart rate goes up. No PND, orthopnea or edema. No lightheadedness, dizziness or wooziness/near-syncope or syncope. No malalignment, hematochezia or hematuria. The most notable symptom he feels when he has these palpitations episodes or rapid heart rate spells is a feels fatigued.  Past Medical History  Diagnosis Date  . Hypothyroidism     s/p hyperthyroid heart failure and RAI rx  . Low HDL (under 40)   . Cardiomyopathy     related to hyperthyroid condition  . Asthma     ?  . Umbilical hernia     surgery consult no intervention if no sx   . Allergic rhinitis   . Atrial fibrillation, persistent     Paroxysmal    Prior Cardiac Evaluation and Past Surgical History: Past Surgical History  Procedure Laterality Date  . No surgical history      Allergies  Allergen Reactions  . Simvastatin     REACTION: myalgias    Current Outpatient Prescriptions  Medication Sig Dispense Refill  . albuterol (PROVENTIL HFA;VENTOLIN HFA) 108 (90 BASE) MCG/ACT inhaler INHALE 1 TO 2 PUFFS BY MOUTH EVERY 6 HOURS AS NEEDED FOR ASTHMA  8.5 each  0  . aspirin 81 MG tablet Take 81 mg by mouth daily.      . budesonide-formoterol (SYMBICORT) 160-4.5 MCG/ACT inhaler Inhale 2 puffs into the lungs 2 (two) times daily. Prn        .  fluticasone (FLONASE) 50 MCG/ACT nasal spray Place 2 sprays into the nose daily. Prn      . SYNTHROID 112 MCG tablet TAKE 1 TABLET BY MOUTH EVERY DAY  30 tablet  5  . verapamil (CALAN-SR) 120 MG CR tablet TAKE 1/2 TABLET BY MOUTH EVERY DAY  30 tablet  3  . verapamil (CALAN-SR) 240 MG CR tablet TAKE 1 TABLET BY MOUTH AT BEDTIME  30 tablet  3   No current facility-administered medications for this visit.    History   Social History  . Marital Status: Married    Spouse Name: N/A    Number of Children: N/A  . Years of Education: N/A   Occupational History  . Truck driver     [employer]   Social History Main  Topics  . Smoking status: Never Smoker   . Smokeless tobacco: Not on file  . Alcohol Use: No  . Drug Use: No  . Sexually Active: Not on file   Other Topics Concern  . 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 Long Hours. 50 hours per week.   Does physical loading.  [employer]    Family History  Problem Relation Age of Onset  . Stroke Mother   . Stomach cancer Father   . Diabetes    . Asthma      sibs  . Alcohol abuse      sib   ROS: A comprehensive Review of Systems - Negative except pertinent positives noted above  PHYSICAL EXAM BP 146/82  Pulse 90  Ht 6\' 3"  (1.905 m)  Wt 200 lb (90.719 kg)  BMI 25 kg/m2 General appearance: alert, cooperative, appears stated age, no distress and healthy appearing. Well-nourished and well-groomed. He has questions appropriately. Normal mood and affect. HEENT: [place]/AT, EOMI, MMM, anicteric sclera Neck: no adenopathy, no carotid bruit, no JVD, supple, symmetrical, trachea midline and actually didn't notice thyroidectomy scar Lungs: clear to auscultation bilaterally, normal percussion bilaterally and non-labored, good air movement Heart: irregularly irregular rhythm, S1, S2 normal, no S3 or S4 and no M/R. nondisplaced PMI Abdomen: soft, non-tender; bowel sounds normal; no masses,  no organomegaly Extremities: extremities normal, atraumatic, no cyanosis or edema, no edema, redness or tenderness in the calves or thighs and no ulcers, gangrene or trophic changes Pulses: 2+ and symmetric Skin: Skin color, texture, turgor normal. No rashes or lesions Neurologic: Mental status: Alert, oriented, thought content appropriate Cranial nerves: normal  UEA:[id number] today: Yes Rate: 90 , Rhythm: Atrial fibrillation; nonspecific ST-T changes   Recent Labs: None  ASSESSMENT/PLAN:  Atrial fibrillation, persistent Cc  relatively well rate controlled currently in atrial fibrillation. He says his knowledge nature fibrillation so is probably paroxysmal albeit persistent in the proximal isn't occur. His CHA2DS2VAsc2 score is only 1 for the hypertension making him intermediate risk with the recommendation of aspirin versus oral anticoagulation.  I don't think she's ever had this conversation and was somewhat taken aback by it. At this point in time I not convinced that or the crit which is necessary, however he does seem to have prolonged episodes of atrial fibrillation that is still paroxysmal. He is at increased risk of stroke with him having recurrences. I think if we did do an evaluation would be with a NOAC. Currently we'll keep him on his aspirin as  is.  Plan: Echocardiogram to assess overall function, ventricular and atrial sizes as well as valves. Discuss potential initiation of anti-coagulation plus or minus antiarrhythmic such as flecainide or Multaq addition to his verapamil. He actually may be a good candidate to consider ablation.   Palpitations Not sure to what these symptoms are Coumadin currently defibrillation it could be related to beats or PVCs. When describing sounds like premature beats where it is a pause in a more forceful beat. They'll happen all the time, and I don't know about it and get any on the monitor. If he still having some as a backup I have him wear monitor just to a series A. fib burden is in DC if there is any other arrhythmia or PVCs/PACs.  HYPERTENSION Not quite adequately trolled. We could potentially increase his verapamil dose in the evening as his heart rate still somewhat fast. The other option would be to add a different blood pressure medication. I want to see what his overall cardiac function looks like prior to doing that.  Dyspnea on exertion He does get a little dyspneic on exertion. Would have a fibrillation, telemetry is no diastolic dysfunction potential with LVH or  if this is simply because of his atrial fibrillation. Either way would make me want to be more likely to consider an antiarrhythmic.   Other malaise and fatigue Sure how to T. would go along with his palpitations. Certainly is having a lot of PVCs he may have worsening cardiac output during those times. It may also be that his nose has a fibrillation twice feeling fatigued. Unable to explain it if he is a fibrillation still when I see him back after his echo.   Per problem list. Orders Placed This Encounter  Procedures  . EKG 12-Lead  . 2D Echocardiogram without contrast    Standing Status: Future     Number of Occurrences:      Standing Expiration Date: 06/03/2014    Order Specific Question:  Type of Echo    Answer:  Complete    Order Specific Question:  Where should this test be performed    Answer:  [facility]    Order Specific Question:  Reason for exam-Echo    Answer:  Dyspnea  786.09   Followup: 2-3 weeks  Damary Doland W. Herbie Baltimore, M.D., M.S. [facility] [address] [place], [place]  [address]  [phone] Pager # [phone]

## 2013-06-10 ENCOUNTER — Ambulatory Visit (HOSPITAL_COMMUNITY)
Admission: RE | Admit: 2013-06-10 | Discharge: 2013-06-10 | Disposition: A | Discharge status: Home / Self Care | Payer: 59 | Source: Ambulatory Visit | Attending: Cardiovascular Disease | Admitting: Cardiovascular Disease

## 2013-06-10 DIAGNOSIS — R0989 Other specified symptoms and signs involving the circulatory and respiratory systems: Secondary | ICD-10-CM | POA: Insufficient documentation

## 2013-06-10 DIAGNOSIS — I4891 Unspecified atrial fibrillation: Secondary | ICD-10-CM

## 2013-06-10 DIAGNOSIS — R002 Palpitations: Secondary | ICD-10-CM

## 2013-06-10 DIAGNOSIS — R0609 Other forms of dyspnea: Secondary | ICD-10-CM | POA: Insufficient documentation

## 2013-06-10 DIAGNOSIS — I059 Rheumatic mitral valve disease, unspecified: Secondary | ICD-10-CM | POA: Insufficient documentation

## 2013-06-10 DIAGNOSIS — I4819 Other persistent atrial fibrillation: Secondary | ICD-10-CM

## 2013-06-10 DIAGNOSIS — I079 Rheumatic tricuspid valve disease, unspecified: Secondary | ICD-10-CM | POA: Insufficient documentation

## 2013-06-10 DIAGNOSIS — R0600 Dyspnea, unspecified: Secondary | ICD-10-CM

## 2013-06-10 NOTE — Progress Notes (Signed)
2D Echo Performed 06/10/2013    Babara Buffalo, RCS  

## 2013-06-11 ENCOUNTER — Telehealth: Payer: Self-pay | Admitting: *Deleted

## 2013-06-11 NOTE — Telephone Encounter (Signed)
Message copied by Tobin Chad on Wed Jun 11, 2013 10:41 AM ------      Message from: [facility], DAVID      Created: Wed Jun 11, 2013  8:07 AM       Relatively normal study.  Left atrium is a bit dilated, makes sense for Atrial Fibrillation.            Otherwise normal.              Marykay Lex, MD       ------

## 2013-06-11 NOTE — Telephone Encounter (Signed)
Spoke to patient.  Echo result given . Verbalized understanding  

## 2013-06-24 ENCOUNTER — Ambulatory Visit: Payer: 59 | Admitting: Cardiology

## 2013-08-16 ENCOUNTER — Other Ambulatory Visit: Payer: Self-pay | Admitting: Internal Medicine

## 2013-09-11 ENCOUNTER — Other Ambulatory Visit: Payer: Self-pay | Admitting: Family Medicine

## 2013-09-11 MED ORDER — VERAPAMIL HCL ER 240 MG PO TBCR: EXTENDED_RELEASE_TABLET | ORAL | Status: DC

## 2013-11-16 ENCOUNTER — Other Ambulatory Visit: Payer: Self-pay | Admitting: Internal Medicine

## 2013-11-18 NOTE — Telephone Encounter (Signed)
Had last lab work on 10/28/2012 He has no future appointment Last seen on 05/19/13 Please advise Thanks!

## 2013-11-19 NOTE — Telephone Encounter (Signed)
Pt following up on refill request. Pt is out as of today! Pharm: [facility]

## 2013-11-20 ENCOUNTER — Other Ambulatory Visit: Payer: Self-pay | Admitting: Family Medicine

## 2013-11-20 ENCOUNTER — Telehealth: Payer: Self-pay | Admitting: Internal Medicine

## 2013-11-20 MED ORDER — SYNTHROID 112 MCG PO TABS: ORAL_TABLET | ORAL | Status: DC

## 2013-11-20 MED ORDER — VERAPAMIL HCL ER 240 MG PO TBCR: EXTENDED_RELEASE_TABLET | ORAL | Status: DC

## 2013-11-20 NOTE — Telephone Encounter (Signed)
Ok to refill x 90 days  Needs fu here and with cardiology  Will need monitoring labs   How can we help him get this done?

## 2013-11-20 NOTE — Telephone Encounter (Signed)
James Nunez/patient Phone [phone] called needed emergent refill of Verapamil 240 mg and Synthroid. [facility]ill request 11/16/13 but as of 11/20/13 had not had a reply from MD. Ran out of meds 11/19/13.  Asking what are side effects from stopping meds abruptly.  BP 140/92.  Confirmed Dr Regis Bill in office AM only 11/20/13.  Last office visit 7/14.  Urgent message sent to office staff for follow up 11/20/13.  Please call patient to advise when refill ordered.

## 2013-11-20 NOTE — Telephone Encounter (Signed)
Bunyan/Patient Phone [phone] called about refill request for  Verapamil 240 mg and Synthroid. [facility]ed [facility]ill 11/15/13; pharmacy send request to MD 11/16/13.  Ran out of both meds 11/19/13.  Asking if there are any effects for suddenly stopping medications.  BP 140/90.  Pharmacy reports no response from MD office yet 11/20/13.  Patient left message with office staff 11/19/13 AM but meds still not refilled.  Last office visit was 7/14.  Please call back to advise when Rx are ordered so can get meds ASAP.

## 2013-11-20 NOTE — Telephone Encounter (Signed)
Please make lab appt and appt with WP.  Thanks! Inform the pt that I sent in medication.

## 2013-11-20 NOTE — Telephone Encounter (Signed)
Can you put lab order in? I did not know which ones to schedule! Thanks.

## 2013-11-20 NOTE — Telephone Encounter (Signed)
Sent in 30 day supply of both medication.  Pt has not had lab work since 2013.  Sent medication request to you.  Please see refill requests.  Thanks!

## 2013-11-21 NOTE — Telephone Encounter (Signed)
cpx labs  With psa if patient wishes this

## 2013-11-21 NOTE — Telephone Encounter (Signed)
Do you want a PSA with these labs?  If so, what dx do you want to use?

## 2013-11-24 ENCOUNTER — Other Ambulatory Visit: Payer: Self-pay | Admitting: Family Medicine

## 2013-11-24 DIAGNOSIS — Z0000 Encounter for general adult medical examination without abnormal findings: Secondary | ICD-10-CM

## 2013-11-24 DIAGNOSIS — R5383 Other fatigue: Secondary | ICD-10-CM

## 2013-11-24 DIAGNOSIS — R5381 Other malaise: Secondary | ICD-10-CM

## 2013-11-24 NOTE — Telephone Encounter (Signed)
I placed the orders for lab work.

## 2013-12-03 ENCOUNTER — Other Ambulatory Visit (INDEPENDENT_AMBULATORY_CARE_PROVIDER_SITE_OTHER): Payer: 59

## 2013-12-03 DIAGNOSIS — R5383 Other fatigue: Secondary | ICD-10-CM

## 2013-12-03 DIAGNOSIS — Z0000 Encounter for general adult medical examination without abnormal findings: Secondary | ICD-10-CM

## 2013-12-03 DIAGNOSIS — R5381 Other malaise: Secondary | ICD-10-CM

## 2013-12-03 LAB — LIPID PANEL
CHOLESTEROL: 158 mg/dL (ref 0–200)
HDL: 29.9 mg/dL — ABNORMAL LOW (ref >39.00)
LDL Cholesterol: 108 mg/dL — ABNORMAL HIGH (ref 0–99)
Total CHOL/HDL Ratio: 5
Triglycerides: 103 mg/dL (ref 0.0–149.0)
VLDL: 20.6 mg/dL (ref 0.0–40.0)

## 2013-12-03 LAB — HEPATIC FUNCTION PANEL
ALBUMIN: 3.8 g/dL (ref 3.5–5.2)
ALT: 19 U/L (ref 0–53)
AST: 21 U/L (ref 0–37)
Alkaline Phosphatase: 87 U/L (ref 39–117)
Bilirubin, Direct: 0.1 mg/dL (ref 0.0–0.3)
Total Bilirubin: 0.6 mg/dL (ref 0.3–1.2)
Total Protein: 8.1 g/dL (ref 6.0–8.3)

## 2013-12-03 LAB — CBC WITH DIFFERENTIAL/PLATELET
Basophils Absolute: 0 10*3/uL (ref 0.0–0.1)
Basophils Relative: 0.4 % (ref 0.0–3.0)
EOS PCT: 2.4 % (ref 0.0–5.0)
Eosinophils Absolute: 0.2 10*3/uL (ref 0.0–0.7)
HEMATOCRIT: 49.5 % (ref 39.0–52.0)
HEMOGLOBIN: 16 g/dL (ref 13.0–17.0)
LYMPHS ABS: 2.2 10*3/uL (ref 0.7–4.0)
Lymphocytes Relative: 31.7 % (ref 12.0–46.0)
MCHC: 32.4 g/dL (ref 30.0–36.0)
MCV: 89 fl (ref 78.0–100.0)
Monocytes Absolute: 0.6 10*3/uL (ref 0.1–1.0)
Monocytes Relative: 8.7 % (ref 3.0–12.0)
NEUTROS ABS: 4 10*3/uL (ref 1.4–7.7)
Neutrophils Relative %: 56.8 % (ref 43.0–77.0)
Platelets: 197 10*3/uL (ref 150.0–400.0)
RBC: 5.57 Mil/uL (ref 4.22–5.81)
RDW: 13.8 % (ref 11.5–14.6)
WBC: 7 10*3/uL (ref 4.5–10.5)

## 2013-12-03 LAB — BASIC METABOLIC PANEL
BUN: 13 mg/dL (ref 6–23)
CALCIUM: 9 mg/dL (ref 8.4–10.5)
CO2: 28 mEq/L (ref 19–32)
Chloride: 107 mEq/L (ref 96–112)
Creatinine, Ser: 1 mg/dL (ref 0.4–1.5)
GFR: 99.34 mL/min (ref >60.00)
GLUCOSE: 105 mg/dL — AB (ref 70–99)
POTASSIUM: 3.7 meq/L (ref 3.5–5.1)
SODIUM: 140 meq/L (ref 135–145)

## 2013-12-03 LAB — TSH: TSH: 2.73 u[IU]/mL (ref 0.35–5.50)

## 2013-12-03 LAB — PSA: PSA: 0.64 ng/mL (ref 0.10–4.00)

## 2013-12-08 ENCOUNTER — Ambulatory Visit (INDEPENDENT_AMBULATORY_CARE_PROVIDER_SITE_OTHER): Payer: 59 | Admitting: Internal Medicine

## 2013-12-08 ENCOUNTER — Encounter: Payer: Self-pay | Admitting: Internal Medicine

## 2013-12-08 VITALS — BP 142/80 | Temp 98.1°F | Ht 73.0 in | Wt 202.0 lb

## 2013-12-08 DIAGNOSIS — K429 Umbilical hernia without obstruction or gangrene: Secondary | ICD-10-CM

## 2013-12-08 DIAGNOSIS — I480 Paroxysmal atrial fibrillation: Secondary | ICD-10-CM

## 2013-12-08 DIAGNOSIS — I10 Essential (primary) hypertension: Secondary | ICD-10-CM

## 2013-12-08 DIAGNOSIS — Z23 Encounter for immunization: Secondary | ICD-10-CM

## 2013-12-08 DIAGNOSIS — E039 Hypothyroidism, unspecified: Secondary | ICD-10-CM

## 2013-12-08 DIAGNOSIS — I4891 Unspecified atrial fibrillation: Secondary | ICD-10-CM

## 2013-12-08 DIAGNOSIS — E786 Lipoprotein deficiency: Secondary | ICD-10-CM

## 2013-12-08 MED ORDER — SYNTHROID 112 MCG PO TABS: ORAL_TABLET | ORAL | Status: DC

## 2013-12-08 MED ORDER — LISINOPRIL 10 MG PO TABS: 10.0000 mg | ORAL_TABLET | Freq: Every day | ORAL | Status: DC

## 2013-12-08 MED ORDER — VERAPAMIL HCL ER 120 MG PO TBCR: EXTENDED_RELEASE_TABLET | ORAL | Status: DC

## 2013-12-08 MED ORDER — VERAPAMIL HCL ER 240 MG PO TBCR: EXTENDED_RELEASE_TABLET | ORAL | Status: DC

## 2013-12-08 NOTE — Patient Instructions (Signed)
Continue verapamil and synthroid Add low dose ace inhibitor for blood pressure controll and heart health. Consider anticoagulation at some point  If not allergic in stead can take aspirin 81- 325 per day    ROV in about 2 months for BP check and  Bmp pre visit. Contact if  Problems in the meantime

## 2013-12-08 NOTE — Progress Notes (Signed)
Pre visit review using our clinic review tool, if applicable. No additional management support is needed unless otherwise documented below in the visit note. Chief Complaint  Patient presents with  . Follow-up    Labs.  Needs refills of verapamil and Synthroid.    HPI: Followup for medication disease management.  Is as last visit he did see cardiology for evaluation of his palpitations. During the visit he was noted to have atrial fibrillation on his EKG for the first time noted. An echocardiogram was ordered but not felt to be alarming at all. Discussion about anticoagulation occurred but the plan was to keep him on aspirin at this time. A blood pressure control. When he went to the office for evaluation in atrial fib he said he felt fine. Therefore felt to be asymptomatic at this time  Asthma ;stable  Rare use of rescue  Ht tending to go 140 high 130s and sometimes higher  On hig dose verapmil feels fine no recnet palpitations of erercise intolerance .  Takes asa per day no unusual bleeding  Thyroid brand med takes doses regularly No flu vaccine this year yet  ROS: See pertinent positives and negatives per HPI. No current chest pain or shortness of breath or cough. No unusual bleeding falling joint swelling injuries not exercising as much as usual. No problems with his umbilical hernia at this time.  Past Medical History  Diagnosis Date  . Hypothyroidism     s/p hyperthyroid heart failure and RAI rx  . Low HDL (under 40)   . Cardiomyopathy     related to hyperthyroid condition  . Asthma     ?  . Umbilical hernia     surgery consult no intervention if no sx   . Allergic rhinitis   . Atrial fibrillation, persistent     Paroxysmal    Family History  Problem Relation Age of Onset  . Stroke Mother   . Stomach cancer Father   . Diabetes    . Asthma      sibs  . Alcohol abuse      sib    History   Social History  . Marital Status: Married    Spouse Name: N/A    Number  of Children: N/A  . Years of Education: N/A   Occupational History  . Truck driver     Anna History Main Topics  . Smoking status: Never Smoker   . Smokeless tobacco: Not on file  . Alcohol Use: No  . Drug Use: No  . Sexual Activity: Not on file   Other Topics Concern  . Not on file   Social History Narrative   Married, 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 Long Hours. 50 hours per week.   Does physical loading.  [place]     Outpatient Encounter Prescriptions as of 12/08/2013  Medication Sig  . albuterol (PROVENTIL HFA;VENTOLIN HFA) 108 (90 BASE) MCG/ACT inhaler INHALE 1 TO 2 PUFFS BY MOUTH EVERY 6 HOURS AS NEEDED FOR ASTHMA  . aspirin 81 MG tablet Take 81 mg by mouth daily.  . budesonide-formoterol (SYMBICORT) 160-4.5 MCG/ACT inhaler Inhale 2 puffs into the lungs 2 (two) times daily. Prn    . fluticasone (FLONASE) 50 MCG/ACT nasal spray Place 2 sprays into the nose daily. Prn  . SYNTHROID 112 MCG tablet TAKE 1 TABLET BY MOUTH  EVERY DAY  . verapamil (CALAN-SR) 120 MG CR tablet TAKE 1/2 TABLET BY MOUTH EVERY DAY  . verapamil (CALAN-SR) 240 MG CR tablet TAKE 1 TABLET BY MOUTH AT BEDTIME  . [DISCONTINUED] SYNTHROID 112 MCG tablet TAKE 1 TABLET BY MOUTH EVERY DAY  . [DISCONTINUED] verapamil (CALAN-SR) 120 MG CR tablet TAKE 1/2 TABLET BY MOUTH EVERY DAY  . [DISCONTINUED] verapamil (CALAN-SR) 240 MG CR tablet TAKE 1 TABLET BY MOUTH AT BEDTIME  . lisinopril (PRINIVIL,ZESTRIL) 10 MG tablet Take 1 tablet (10 mg total) by mouth daily.    EXAM:  BP 142/80  Temp(Src) 98.1 F (36.7 C) (Oral)  Ht 6\' 1"  (1.854 m)  Wt 202 lb (91.627 kg)  BMI 26.66 kg/m2  Body mass index is 26.66 kg/(m^2).  GENERAL: vitals reviewed and listed above, alert, oriented, appears well hydrated and in no acute distress HEENT: atraumatic, conjunctiva  clear, no obvious abnormalities on inspection of  external nose and ears NECK: no obvious masses on inspection palpation no jvd LUNGS: clear to auscultation bilaterally, no wheezes, rales or rhonchi, good air movement CV: HRRR, no clubbing cyanosis or  peripheral edema nl cap refill  Abdomen soft without organomegaly guarding or rebound there is a small umbilical hernia that s easily reduced no pain or redness MS: moves all extremities without noticeable focal  abnormality PSYCH: pleasant and cooperative, no obvious depression or anxiety ASSESSMENT AND PLAN:  Discussed the following assessment and plan:  Unspecified essential hypertension - Borderline control discussed add on medications ACE inhibitor though not powerful may  be more cardiac protective.  Hypothyroidism - In range continue medicine  Umbilical hernia  Low HDL (under 40)  Paroxysmal atrial fibrillation - Currently appears to be in sinus rhythm no symptoms CHAD2 DS 2 vascular stroke risk assessment score 1   Need for prophylactic vaccination and inoculation against influenza - Plan: Flu Vaccine QUAD 36+ mos PF IM (Fluarix) Echo normal except for mild left atrial dilatation. Attentional side effects of ACE inhibitor discussed. -Patient advised to return or notify health care team  if symptoms worsen or persist or new concerns arise.  Patient Instructions  Continue verapamil and synthroid Add low dose ace inhibitor for blood pressure controll and heart health. Consider anticoagulation at some point  If not allergic in stead can take aspirin 81- 325 per day    ROV in about 2 months for BP check and  Bmp pre visit. Contact if  Problems in the meantime     Wanda K. Panosh M.D.

## 2013-12-09 ENCOUNTER — Telehealth: Payer: Self-pay | Admitting: Internal Medicine

## 2013-12-09 NOTE — Telephone Encounter (Signed)
Relevant patient education assigned to patient using Emmi. ° °

## 2013-12-17 ENCOUNTER — Other Ambulatory Visit: Payer: Self-pay | Admitting: Internal Medicine

## 2013-12-18 ENCOUNTER — Telehealth: Payer: Self-pay | Admitting: Internal Medicine

## 2013-12-18 NOTE — Telephone Encounter (Signed)
It changes to generic synthroid  Need to recheck tsh in 3 months .  Keep same generic from bottle to bottle .  Can rx synthroid generic 90 #  Refill x 1  Get tsh in 3 months

## 2013-12-18 NOTE — Telephone Encounter (Signed)
Pt is inquiring about getting a generic rx for SYNTHROID 112 MCG tablet, please call pharmacy if possible.

## 2013-12-19 ENCOUNTER — Other Ambulatory Visit: Payer: Self-pay | Admitting: Internal Medicine

## 2013-12-22 MED ORDER — LEVOTHYROXINE SODIUM 112 MCG PO TABS: 112.0000 ug | ORAL_TABLET | Freq: Every day | ORAL | Status: DC

## 2013-12-22 NOTE — Telephone Encounter (Signed)
I spoke to the pt.  He did want to change to a generic.  He has made a future lab appt and I sent the rx to his pharmacy.

## 2014-01-05 ENCOUNTER — Other Ambulatory Visit: Payer: Self-pay | Admitting: Internal Medicine

## 2014-01-15 ENCOUNTER — Telehealth: Payer: Self-pay | Admitting: Internal Medicine

## 2014-01-15 NOTE — Telephone Encounter (Signed)
Noted  

## 2014-01-15 NOTE — Telephone Encounter (Signed)
Patient Information:  Caller Name: Zaylon  Phone: [phone]  Patient: James Nunez, James Nunez  Gender: Male  DOB: 09-25-1956  Age: 58 Years  PCP: [facility])  Office Follow Up:  Does the office need to follow up with this patient?: No  Instructions For The Office: N/A  RN Note:  Not using Flonase, Symbicort or antihistamine.  Using Albuterol only at night with improvement of symptoms. 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  Instructed to start otc non drowsy antihistamine now.  Unable to be seen immediately in office since is out of town; returning 01/15/14 after office hours.  Advised to be seen at local UC now.    Symptoms  Reason For Call & Symptoms: "Seasonal allergies aggravating bronchitis" and causing dry cough.  Requesting "cough syrup" for coughing spells with wheezing.  Audible constant cough; last used Albuterol 01/14/14.  Reviewed Health History In EMR: Yes  Reviewed Medications In EMR: Yes  Reviewed Allergies In EMR: Yes  Reviewed Surgeries / Procedures: Yes  Date of Onset of Symptoms: 12/29/2013  Treatments Tried: Albuterol MDI at night, DM at night  Treatments Tried Worked: Yes  Guideline(s) Used:  Asthma Attack  Disposition Per Guideline:   Go to ED Now (or to Office with PCP Approval)  Reason For Disposition Reached:   Severe wheezing or coughing and doesn't have nebulizer or inhaler available  Advice Given:  Quick-Relief Asthma Medicine:   Start your quick-relief medicine (e.g., albuterol, salbutamol) at the first sign of any coughing or shortness of breath (don't wait for wheezing). Use your inhaler (2 puffs each time) or nebulizer every 4 hours. Continue the quick-relief medicine until you have not wheezed or coughed for 48 hours.  The best "cough medicine" for an adult with asthma is always the asthma medicine (Note: Don't use cough suppressants, but cough drops may help a tickly  cough).  Long-Term-Control Asthma Medicine:  If you are using a controller medicine (e.g., inhaled steroids or cromolyn), continue to take it as directed.  Drinking Liquids:  Try to drink normal amount of liquids (e.g., water). Being adequately hydrated makes it easier to cough up the sticky lung mucus.  Humidifier:   If the air is dry, use a cool mist humidifier to prevent drying of the upper airway.  Hay Fever  : If you have nasal symptoms from hay fever, it's OK to take antihistamines (Reasons: poor control of allergic rhinitis makes asthma worse whereas antihistamines don't make asthma worse).  Remove Allergens:  Take a shower to remove pollens, animal dander, or other allergens from the body and hair.  Work with Your Doctor:  There is no cure for asthma, but you can take charge and learn to control it. The best way to take charge of asthma is to work with your doctor (over many months) to find the right controller (preventive) medicine so your asthma is under control. If you keep having asthma attacks, then the asthma is not under control. People can die from asthma if they do not take it seriously and work with a doctor to control it.  Expected Course:  If treatment is started early, most asthma attacks are quickly brought under control. All wheezing should be gone by 5 days.  Call Back If:  Inhaled asthma medicine (nebulizer or inhaler) is needed more often than every 4 hours  Wheezing has not completely cleared after 5 days  You become worse.  RN Overrode Recommendation:  [facility] To [facility]  Out of town with asthma attack.

## 2014-02-09 ENCOUNTER — Other Ambulatory Visit: Payer: 59

## 2014-02-16 ENCOUNTER — Ambulatory Visit: Payer: 59 | Admitting: Internal Medicine

## 2014-02-23 ENCOUNTER — Ambulatory Visit: Payer: 59 | Admitting: Internal Medicine

## 2014-02-25 NOTE — Telephone Encounter (Signed)
Encounter has been closed--TP 02/25/14 

## 2014-12-02 ENCOUNTER — Other Ambulatory Visit: Payer: Self-pay | Admitting: Internal Medicine

## 2014-12-02 NOTE — Telephone Encounter (Signed)
Sent to the pharmacy by e-scribe.  Pt has upcoming CPX on 12/28/14

## 2014-12-11 ENCOUNTER — Other Ambulatory Visit: Payer: Self-pay | Admitting: Internal Medicine

## 2014-12-11 NOTE — Telephone Encounter (Signed)
Sent to the pharmacy by e-scribe.  Pt has upcoming CPX on 12/28/14

## 2014-12-21 ENCOUNTER — Other Ambulatory Visit: Payer: Self-pay

## 2014-12-22 ENCOUNTER — Other Ambulatory Visit (INDEPENDENT_AMBULATORY_CARE_PROVIDER_SITE_OTHER): Payer: 59

## 2014-12-22 DIAGNOSIS — Z0000 Encounter for general adult medical examination without abnormal findings: Secondary | ICD-10-CM

## 2014-12-22 LAB — BASIC METABOLIC PANEL
BUN: 13 mg/dL (ref 6–23)
CHLORIDE: 106 meq/L (ref 96–112)
CO2: 30 mEq/L (ref 19–32)
Calcium: 8.9 mg/dL (ref 8.4–10.5)
Creatinine, Ser: 0.99 mg/dL (ref 0.40–1.50)
GFR: 98.98 mL/min (ref >60.00)
GLUCOSE: 113 mg/dL — AB (ref 70–99)
POTASSIUM: 3.6 meq/L (ref 3.5–5.1)
Sodium: 140 mEq/L (ref 135–145)

## 2014-12-22 LAB — CBC WITH DIFFERENTIAL/PLATELET
BASOS ABS: 0 10*3/uL (ref 0.0–0.1)
Basophils Relative: 0.5 % (ref 0.0–3.0)
Eosinophils Absolute: 0.2 10*3/uL (ref 0.0–0.7)
Eosinophils Relative: 3.1 % (ref 0.0–5.0)
HEMATOCRIT: 44.2 % (ref 39.0–52.0)
HEMOGLOBIN: 14.9 g/dL (ref 13.0–17.0)
LYMPHS ABS: 2.1 10*3/uL (ref 0.7–4.0)
Lymphocytes Relative: 34.5 % (ref 12.0–46.0)
MCHC: 33.6 g/dL (ref 30.0–36.0)
MCV: 84.5 fl (ref 78.0–100.0)
MONO ABS: 0.5 10*3/uL (ref 0.1–1.0)
MONOS PCT: 8.9 % (ref 3.0–12.0)
NEUTROS ABS: 3.2 10*3/uL (ref 1.4–7.7)
Neutrophils Relative %: 53 % (ref 43.0–77.0)
Platelets: 198 10*3/uL (ref 150.0–400.0)
RBC: 5.23 Mil/uL (ref 4.22–5.81)
RDW: 13.9 % (ref 11.5–15.5)
WBC: 6 10*3/uL (ref 4.0–10.5)

## 2014-12-22 LAB — LIPID PANEL
Cholesterol: 159 mg/dL (ref 0–200)
HDL: 29.8 mg/dL — ABNORMAL LOW (ref >39.00)
LDL Cholesterol: 100 mg/dL — ABNORMAL HIGH (ref 0–99)
NonHDL: 129.2
Total CHOL/HDL Ratio: 5
Triglycerides: 144 mg/dL (ref 0.0–149.0)
VLDL: 28.8 mg/dL (ref 0.0–40.0)

## 2014-12-22 LAB — PSA: PSA: 0.74 ng/mL (ref 0.10–4.00)

## 2014-12-22 LAB — HEPATIC FUNCTION PANEL
ALK PHOS: 86 U/L (ref 39–117)
ALT: 15 U/L (ref 0–53)
AST: 15 U/L (ref 0–37)
Albumin: 3.7 g/dL (ref 3.5–5.2)
BILIRUBIN DIRECT: 0.1 mg/dL (ref 0.0–0.3)
BILIRUBIN TOTAL: 0.4 mg/dL (ref 0.2–1.2)
TOTAL PROTEIN: 7.9 g/dL (ref 6.0–8.3)

## 2014-12-22 LAB — TSH: TSH: 0.37 u[IU]/mL (ref 0.35–4.50)

## 2014-12-23 ENCOUNTER — Other Ambulatory Visit: Payer: Self-pay

## 2014-12-25 ENCOUNTER — Other Ambulatory Visit: Payer: Self-pay | Admitting: Internal Medicine

## 2014-12-28 ENCOUNTER — Encounter: Payer: Self-pay | Admitting: Internal Medicine

## 2014-12-28 ENCOUNTER — Ambulatory Visit (INDEPENDENT_AMBULATORY_CARE_PROVIDER_SITE_OTHER): Payer: 59 | Admitting: Internal Medicine

## 2014-12-28 VITALS — BP 136/82 | Temp 98.2°F | Ht 73.25 in | Wt 197.0 lb

## 2014-12-28 DIAGNOSIS — E039 Hypothyroidism, unspecified: Secondary | ICD-10-CM | POA: Insufficient documentation

## 2014-12-28 DIAGNOSIS — I480 Paroxysmal atrial fibrillation: Secondary | ICD-10-CM

## 2014-12-28 DIAGNOSIS — E786 Lipoprotein deficiency: Secondary | ICD-10-CM

## 2014-12-28 DIAGNOSIS — R739 Hyperglycemia, unspecified: Secondary | ICD-10-CM

## 2014-12-28 DIAGNOSIS — I10 Essential (primary) hypertension: Secondary | ICD-10-CM | POA: Insufficient documentation

## 2014-12-28 DIAGNOSIS — Z0000 Encounter for general adult medical examination without abnormal findings: Secondary | ICD-10-CM

## 2014-12-28 DIAGNOSIS — J45998 Other asthma: Secondary | ICD-10-CM

## 2014-12-28 DIAGNOSIS — Z2821 Immunization not carried out because of patient refusal: Secondary | ICD-10-CM

## 2014-12-28 MED ORDER — VERAPAMIL HCL ER 120 MG PO TBCR: 60.0000 mg | EXTENDED_RELEASE_TABLET | Freq: Every day | ORAL | Status: DC

## 2014-12-28 MED ORDER — LISINOPRIL 10 MG PO TABS: 10.0000 mg | ORAL_TABLET | Freq: Every day | ORAL | Status: DC

## 2014-12-28 NOTE — Patient Instructions (Addendum)
Get a colonoscopy. for colon cancer screening ifcant do the stool cards either  Continue lifestyle intervention healthy eating and exercise . Decrease sweets and simple carb to avoid getting diabetes. Plan and prepare on the job  Will send copy of note to cardiology . If getting prolonged episode of fast heartbeat need to return, seek emergent care if continuing . Sometimes can take an extra dose of a medication to slow the HR down.  Same medication otherwise today   Estimated risk based on CHADSDSVASC score  Is 0.6 % per year of stroke  higher when you reach age 61 and if you develop diabetes  At that time blood thinners besides asa may be advised.   Healthy lifestyle includes : At least 150 minutes of exercise weeks  , weight at healthy levels, which is usually   BMI 19-25. Avoid trans fats and processed foods;  Increase fresh fruits and veges to 5 servings per day. And avoid sweet beverages including tea and juice. Mediterranean diet with olive oil and nuts have been noted to be heart and brain healthy . Avoid tobacco products . Limit  alcohol to  7 per week for women and 14 servings for men.  Get adequate sleep . Wear seat belts . Don't text and drive .      Why follow it? Research shows. . Those who follow the Mediterranean diet have a reduced risk of heart disease  . The diet is associated with a reduced incidence of Parkinson's and Alzheimer's diseases . People following the diet may have longer life expectancies and lower rates of chronic diseases  . The Dietary Guidelines for Americans recommends the Mediterranean diet as an eating plan to promote health and prevent disease  What Is the Mediterranean Diet?  . Healthy eating plan based on typical foods and recipes of Mediterranean-style cooking . The diet is primarily a plant based diet; these foods should make up a majority of meals   Starches - Plant based foods should make up a majority of meals - They are an important  sources of vitamins, minerals, energy, antioxidants, and fiber - Choose whole grains, foods high in fiber and minimally processed items  - Typical grain sources include wheat, oats, barley, corn, brown rice, bulgar, farro, millet, polenta, couscous  - Various types of beans include chickpeas, lentils, fava beans, black beans, white beans   Fruits  Veggies - Large quantities of antioxidant rich fruits & veggies; 6 or more servings  - Vegetables can be eaten raw or lightly drizzled with oil and cooked  - Vegetables common to the traditional Mediterranean Diet include: artichokes, arugula, beets, broccoli, brussel sprouts, cabbage, carrots, celery, collard greens, cucumbers, eggplant, kale, leeks, lemons, lettuce, mushrooms, okra, onions, peas, peppers, potatoes, pumpkin, radishes, rutabaga, shallots, spinach, sweet potatoes, turnips, zucchini - Fruits common to the Mediterranean Diet include: apples, apricots, avocados, cherries, clementines, dates, figs, grapefruits, grapes, melons, nectarines, oranges, peaches, pears, pomegranates, strawberries, tangerines  Fats - Replace butter and margarine with healthy oils, such as olive oil, canola oil, and tahini  - Limit nuts to no more than a handful a day  - Nuts include walnuts, almonds, pecans, pistachios, pine nuts  - Limit or avoid candied, honey roasted or heavily salted nuts - Olives are central to the Mediterranean diet - can be eaten whole or used in a variety of dishes   Meats Protein - Limiting red meat: no more than a few times a month - When eating red meat: choose  lean cuts and keep the portion to the size of deck of cards - Eggs: approx. 0 to 4 times a week  - Fish and lean poultry: at least 2 a week  - Healthy protein sources include, chicken, [place], lean beef, lamb - Increase intake of seafood such as tuna, salmon, trout, mackerel, shrimp, scallops - Avoid or limit high fat processed meats such as sausage and bacon  Dairy - Include  moderate amounts of low fat dairy products  - Focus on healthy dairy such as fat free yogurt, skim milk, low or reduced fat cheese - Limit dairy products higher in fat such as whole or 2% milk, cheese, ice cream  Alcohol - Moderate amounts of red wine is ok  - No more than 5 oz daily for women (all ages) and men older than age 15  - No more than 10 oz of wine daily for men younger than 78  Other - Limit sweets and other desserts  - Use herbs and spices instead of salt to flavor foods  - Herbs and spices common to the traditional Mediterranean Diet include: basil, bay leaves, chives, cloves, cumin, fennel, garlic, lavender, marjoram, mint, oregano, parsley, pepper, rosemary, sage, savory, sumac, tarragon, thyme   It's not just a diet, it's a lifestyle:  . The Mediterranean diet includes lifestyle factors typical of those in the region  . Foods, drinks and meals are best eaten with others and savored . Daily physical activity is important for overall good health . This could be strenuous exercise like running and aerobics . This could also be more leisurely activities such as walking, housework, yard-work, or taking the stairs . Moderation is the key; a balanced and healthy diet accommodates most foods and drinks . Consider portion sizes and frequency of consumption of certain foods   Meal Ideas & Options:  . Breakfast:  o Whole wheat toast or whole wheat English muffins with peanut butter & hard boiled egg o Steel cut oats topped with apples & cinnamon and skim milk  o Fresh fruit: banana, strawberries, melon, berries, peaches  o Smoothies: strawberries, bananas, greek yogurt, peanut butter o Low fat greek yogurt with blueberries and granola  o Egg white omelet with spinach and mushrooms o Breakfast couscous: whole wheat couscous, apricots, skim milk, cranberries  . Sandwiches:  o Hummus and grilled vegetables (peppers, zucchini, squash) on whole wheat bread   o Grilled chicken on whole  wheat pita with lettuce, tomatoes, cucumbers or tzatziki  o Tuna salad on whole wheat bread: tuna salad made with greek yogurt, olives, red peppers, capers, green onions o Garlic rosemary lamb pita: lamb sauted with garlic, rosemary, salt & pepper; add lettuce, cucumber, greek yogurt to pita - flavor with lemon juice and black pepper  . Seafood:  o Mediterranean grilled salmon, seasoned with garlic, basil, parsley, lemon juice and black pepper o Shrimp, lemon, and spinach whole-grain pasta salad made with low fat greek yogurt  o Seared scallops with lemon orzo  o Seared tuna steaks seasoned salt, pepper, coriander topped with tomato mixture of olives, tomatoes, olive oil, minced garlic, parsley, green onions and cappers  . Meats:  o Herbed greek chicken salad with kalamata olives, cucumber, feta  o Red bell peppers stuffed with spinach, bulgur, lean ground beef (or lentils) & topped with feta   o Kebabs: skewers of chicken, tomatoes, onions, zucchini, squash  o [place] burgers: made with red onions, mint, dill, lemon juice, feta cheese topped with roasted red  peppers . Vegetarian o Cucumber salad: cucumbers, artichoke hearts, celery, red onion, feta cheese, tossed in olive oil & lemon juice  o Hummus and whole grain pita points with a greek salad (lettuce, tomato, feta, olives, cucumbers, red onion) o Lentil soup with celery, carrots made with vegetable broth, garlic, salt and pepper  o Tabouli salad: parsley, bulgur, mint, scallions, cucumbers, tomato, radishes, lemon juice, olive oil, salt and pepper.

## 2014-12-28 NOTE — Progress Notes (Signed)
Pre visit review using our clinic review tool, if applicable. No additional management support is needed unless otherwise documented below in the visit note.  Chief Complaint  Patient presents with  . Annual Exam    refill meds     HPI: Patient  James Nunez  59 y.o. comes in today for Preventive Health Care visit    Not noticeable so much  Not maybe once a month and 10 - 60 minutes   Tired no syncope ocass sweat.    Not at night  Never fu cars thought it was a prn fu . o needed No numbness   some seasonal asthma .  Inhaler as needed.   No se of bp medication seems ok   asthma seasonal used controller inhaler in season to help   Likes sweets cakes pies etc . Cutting back on sodas sweet drinks   Works driver  Long hours  Didn't get colonoscopy cause of schedule too far out from gi and had to cancel not interested ion stool cards after idscussion  Health Maintenance  Topic Date Due  . HIV Screening  06/05/1971  . ZOSTAVAX  06/05/2014  . INFLUENZA VACCINE  06/06/2014  . COLONOSCOPY  11/06/2018  . TETANUS/TDAP  11/06/2018   Health Maintenance Review LIFESTYLE:  Exercise:   Not really outside of work .   Tobacco/ETS: no Alcohol:  no Sugar beverages:  koolaid  Less sodas   Sparkling water .  Sleep:  5 hours   Interrupted .    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    ROS:  GEN/ HEENT: No fever, significant weight changes sweats headaches vision problems hearing changes, CV/ PULM; No chest pain shortness of breath cough, syncope,edema  change in exercise tolerance. GI /GU: No adominal pain, vomiting, change in bowel habits. No blood in the stool. No significant GU symptoms. SKIN/HEME: ,no acute skin rashes suspicious lesions or bleeding. No lymphadenopathy, nodules, masses.  NEURO/ PSYCH:  No neurologic signs such as weakness numbness. No depression anxiety. IMM/ Allergy: No unusual infections.  Allergy .   REST of 12 system review  negative except as per HPI   Past Medical History  Diagnosis Date  . Hypothyroidism     s/p hyperthyroid heart failure and RAI rx  . Low HDL (under 40)   . Cardiomyopathy     related to hyperthyroid condition  . Asthma     ?  . Umbilical hernia     surgery consult no intervention if no sx   . Allergic rhinitis   . Atrial fibrillation, persistent     Paroxysmal    Past Surgical History  Procedure Laterality Date  . No surgical history      Family History  Problem Relation Age of Onset  . Stroke Mother   . Stomach cancer Father   . Diabetes    . Asthma      sibs  . Alcohol abuse      sib    History   Social History  . Marital Status: Married    Spouse Name: N/A  . Number of Children: N/A  . Years of Education: N/A   Occupational History  . Truck driver     [place] History Main Topics  . Smoking status: Never Smoker   . Smokeless tobacco: Not on file  . Alcohol Use: No  . Drug Use: No  . Sexual Activity: Not  on file   Other Topics Concern  . None   Social History Narrative   Married, 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 Long Hours. 50 hours per week.   Does physical loading.  [place]     Outpatient Encounter Prescriptions as of 12/28/2014  Medication Sig  . albuterol (PROVENTIL HFA;VENTOLIN HFA) 108 (90 BASE) MCG/ACT inhaler INHALE 1 TO 2 PUFFS BY MOUTH EVERY 6 HOURS AS NEEDED FOR ASTHMA  . aspirin 81 MG tablet Take 81 mg by mouth daily.  . budesonide-formoterol (SYMBICORT) 160-4.5 MCG/ACT inhaler Inhale 2 puffs into the lungs 2 (two) times daily. Prn    . fluticasone (FLONASE) 50 MCG/ACT nasal spray Place 2 sprays into the nose daily. Prn  . levothyroxine (SYNTHROID, LEVOTHROID) 112 MCG tablet Take 1 tablet (112 mcg total) by mouth daily.  Marland Kitchen lisinopril (PRINIVIL,ZESTRIL) 10 MG tablet Take 1 tablet (10 mg total) by mouth daily.  . verapamil  (CALAN-SR) 120 MG CR tablet Take 0.5 tablets (60 mg total) by mouth daily.  . verapamil (CALAN-SR) 240 MG CR tablet Take 1 tablet (240 mg total) by mouth daily.  . [DISCONTINUED] levothyroxine (SYNTHROID, LEVOTHROID) 112 MCG tablet TAKE 1 TABLET BY MOUTH EVERY DAY  . [DISCONTINUED] lisinopril (PRINIVIL,ZESTRIL) 10 MG tablet Take 1 tablet (10 mg total) by mouth daily.  . [DISCONTINUED] verapamil (CALAN-SR) 120 MG CR tablet TAKE 1/2 TABLET BY MOUTH EVERY DAY    EXAM:  BP 136/82 mmHg  Temp(Src) 98.2 F (36.8 C) (Oral)  Ht 6' 1.25" (1.861 m)  Wt 197 lb (89.359 kg)  BMI 25.80 kg/m2  Body mass index is 25.8 kg/(m^2).  Physical Exam: Vital signs reviewed PYP:PJKD is a well-developed well-nourished alert cooperative    who appearsr stated age in no acute distress.  HEENT: normocephalic atraumatic , Eyes: PERRL EOM's full, conjunctiva clear, Nares: paten,t no deformity discharge or tenderness., Ears: no deformity EAC's clear TMs with normal landmarks. Mouth: clear OP, no lesions, edema.  Moist mucous membranes. Dentition in adequate repair. NECK: supple without masses, thyromegaly or bruits. CHEST/PULM:  Clear to auscultation and percussion breath sounds equal no wheeze , rales or rhonchi. No chest wall deformities or tenderness. CV: PMI is nondisplaced, S1 S2 no gallops, murmurs, rubs. Peripheral pulses are full without delay.No JVD .  ABDOMEN: Bowel sounds normal nontender  No guard or rebound, no hepato splenomegal no CVA tenderness.  umbi area  1 fb swelling no mass  Extremtities:  No clubbing cyanosis or edema, no acute joint swelling or redness no focal atrophy Declined prostate exam today  NEURO:  Oriented x3, cranial nerves 3-12 appear to be intact, no obvious focal weakness,gait within normal limits no abnormal reflexes or asymmetrical SKIN: No acute rashes normal turgor, color, no bruising or petechiae. PSYCH: Oriented, good eye contact, no obvious depression anxiety, cognition and  judgment appear normal. LN: no cervical axillary inguinal adenopathy  Lab Results  Component Value Date   WBC 6.0 12/22/2014   HGB 14.9 12/22/2014   HCT 44.2 12/22/2014   PLT 198.0 12/22/2014   GLUCOSE 113* 12/22/2014   CHOL 159 12/22/2014   TRIG 144.0 12/22/2014   HDL 29.80* 12/22/2014   LDLCALC 100* 12/22/2014   ALT 15 12/22/2014   AST 15 12/22/2014   NA 140 12/22/2014   K 3.6 12/22/2014   CL 106 12/22/2014   CREATININE 0.99 12/22/2014   BUN  13 12/22/2014   CO2 30 12/22/2014   TSH 0.37 12/22/2014   PSA 0.74 12/22/2014    ASSESSMENT AND PLAN:  Discussed the following assessment and plan:  Visit for preventive health examination - get colonscopy  Hyperglycemia - pre diabetic readings  counseled dietary straegies discussed .   Paroxysmal atrial fibrillation - once or twice a month or less disc stroke risk if contineut to seek care   Low HDL (under 40) - lsi had se of simva  Hypothyroidism, unspecified hypothyroidism type - post graves rx  contrtolled at this time  Seasonal asthma  Essential hypertension - controlled  Influenza vaccination declined  Patient Care Team: Burnis Medin, MD as PCP - General Leonie Man, MD as Consulting Physician (Cardiology) Patient Instructions   Get a colonoscopy. for colon cancer screening ifcant do the stool cards either  Continue lifestyle intervention healthy eating and exercise . Decrease sweets and simple carb to avoid getting diabetes. Plan and prepare on the job  Will send copy of note to cardiology . If getting prolonged episode of fast heartbeat need to return, seek emergent care if continuing . Sometimes can take an extra dose of a medication to slow the HR down.  Same medication otherwise today   Estimated risk based on CHADSDSVASC score  Is 0.6 % per year of stroke  higher when you reach age 54 and if you develop diabetes  At that time blood thinners besides asa may be advised.   Healthy lifestyle includes  : At least 150 minutes of exercise weeks  , weight at healthy levels, which is usually   BMI 19-25. Avoid trans fats and processed foods;  Increase fresh fruits and veges to 5 servings per day. And avoid sweet beverages including tea and juice. Mediterranean diet with olive oil and nuts have been noted to be heart and brain healthy . Avoid tobacco products . Limit  alcohol to  7 per week for women and 14 servings for men.  Get adequate sleep . Wear seat belts . Don't text and drive .      Why follow it? Research shows. . Those who follow the Mediterranean diet have a reduced risk of heart disease  . The diet is associated with a reduced incidence of Parkinson's and Alzheimer's diseases . People following the diet may have longer life expectancies and lower rates of chronic diseases  . The Dietary Guidelines for Americans recommends the Mediterranean diet as an eating plan to promote health and prevent disease  What Is the Mediterranean Diet?  . Healthy eating plan based on typical foods and recipes of Mediterranean-style cooking . The diet is primarily a plant based diet; these foods should make up a majority of meals   Starches - Plant based foods should make up a majority of meals - They are an important sources of vitamins, minerals, energy, antioxidants, and fiber - Choose whole grains, foods high in fiber and minimally processed items  - Typical grain sources include wheat, oats, barley, corn, brown rice, bulgar, farro, millet, polenta, couscous  - Various types of beans include chickpeas, lentils, fava beans, black beans, white beans   Fruits  Veggies - Large quantities of antioxidant rich fruits & veggies; 6 or more servings  - Vegetables can be eaten raw or lightly drizzled with oil and cooked  - Vegetables common to the traditional Mediterranean Diet include: artichokes, arugula, beets, broccoli, brussel sprouts, cabbage, carrots, celery, collard greens, cucumbers, eggplant, kale,  leeks, lemons,  lettuce, mushrooms, okra, onions, peas, peppers, potatoes, pumpkin, radishes, rutabaga, shallots, spinach, sweet potatoes, turnips, zucchini - Fruits common to the Mediterranean Diet include: apples, apricots, avocados, cherries, clementines, dates, figs, grapefruits, grapes, melons, nectarines, oranges, peaches, pears, pomegranates, strawberries, tangerines  Fats - Replace butter and margarine with healthy oils, such as olive oil, canola oil, and tahini  - Limit nuts to no more than a handful a day  - Nuts include walnuts, almonds, pecans, pistachios, pine nuts  - Limit or avoid candied, honey roasted or heavily salted nuts - Olives are central to the [employer] - can be eaten whole or used in a variety of dishes   Meats Protein - Limiting red meat: no more than a few times a month - When eating red meat: choose lean cuts and keep the portion to the size of deck of cards - Eggs: approx. 0 to 4 times a week  - Fish and lean poultry: at least 2 a week  - Healthy protein sources include, chicken, [place], lean beef, lamb - Increase intake of seafood such as tuna, salmon, trout, mackerel, shrimp, scallops - Avoid or limit high fat processed meats such as sausage and bacon  Dairy - Include moderate amounts of low fat dairy products  - Focus on healthy dairy such as fat free yogurt, skim milk, low or reduced fat cheese - Limit dairy products higher in fat such as whole or 2% milk, cheese, ice cream  Alcohol - Moderate amounts of red wine is ok  - No more than 5 oz daily for women (all ages) and men older than age 42  - No more than 10 oz of wine daily for men younger than 49  Other - Limit sweets and other desserts  - Use herbs and spices instead of salt to flavor foods  - Herbs and spices common to the traditional Mediterranean Diet include: basil, bay leaves, chives, cloves, cumin, fennel, garlic, lavender, marjoram, mint, oregano, parsley, pepper, rosemary, sage, savory,  sumac, tarragon, thyme   It's not just a diet, it's a lifestyle:  . The Mediterranean diet includes lifestyle factors typical of those in the region  . Foods, drinks and meals are best eaten with others and savored . Daily physical activity is important for overall good health . This could be strenuous exercise like running and aerobics . This could also be more leisurely activities such as walking, housework, yard-work, or taking the stairs . Moderation is the key; a balanced and healthy diet accommodates most foods and drinks . Consider portion sizes and frequency of consumption of certain foods   Meal Ideas & Options:  . Breakfast:  o Whole wheat toast or whole wheat English muffins with peanut butter & hard boiled egg o Steel cut oats topped with apples & cinnamon and skim milk  o Fresh fruit: banana, strawberries, melon, berries, peaches  o Smoothies: strawberries, bananas, greek yogurt, peanut butter o Low fat greek yogurt with blueberries and granola  o Egg white omelet with spinach and mushrooms o Breakfast couscous: whole wheat couscous, apricots, skim milk, cranberries  . Sandwiches:  o Hummus and grilled vegetables (peppers, zucchini, squash) on whole wheat bread   o Grilled chicken on whole wheat pita with lettuce, tomatoes, cucumbers or tzatziki  o Tuna salad on whole wheat bread: tuna salad made with greek yogurt, olives, red peppers, capers, green onions o Garlic rosemary lamb pita: lamb sauted with garlic, rosemary, salt & pepper; add lettuce, cucumber, greek yogurt to  pita - flavor with lemon juice and black pepper  . Seafood:  o Mediterranean grilled salmon, seasoned with garlic, basil, parsley, lemon juice and black pepper o Shrimp, lemon, and spinach whole-grain pasta salad made with low fat greek yogurt  o Seared scallops with lemon orzo  o Seared tuna steaks seasoned salt, pepper, coriander topped with tomato mixture of olives, tomatoes, olive oil, minced garlic,  parsley, green onions and cappers  . Meats:  o Herbed greek chicken salad with kalamata olives, cucumber, feta  o Red bell peppers stuffed with spinach, bulgur, lean ground beef (or lentils) & topped with feta   o Kebabs: skewers of chicken, tomatoes, onions, zucchini, squash  o [place] burgers: made with red onions, mint, dill, lemon juice, feta cheese topped with roasted red peppers . Vegetarian o Cucumber salad: cucumbers, artichoke hearts, celery, red onion, feta cheese, tossed in olive oil & lemon juice  o Hummus and whole grain pita points with a greek salad (lettuce, tomato, feta, olives, cucumbers, red onion) o Lentil soup with celery, carrots made with vegetable broth, garlic, salt and pepper  o Tabouli salad: parsley, bulgur, mint, scallions, cucumbers, tomato, radishes, lemon juice, olive oil, salt and pepper.            Standley Brooking. Rontavious Albright M.D.

## 2014-12-28 NOTE — Telephone Encounter (Signed)
Sent to the pharmacy by e-scribe. 

## 2014-12-29 ENCOUNTER — Other Ambulatory Visit: Payer: Self-pay | Admitting: Internal Medicine

## 2014-12-30 ENCOUNTER — Encounter: Payer: Self-pay | Admitting: Internal Medicine

## 2014-12-30 NOTE — Telephone Encounter (Signed)
Sent to the pharmacy by e-scribe. 

## 2015-01-01 ENCOUNTER — Other Ambulatory Visit: Payer: Self-pay | Admitting: Internal Medicine

## 2015-01-07 ENCOUNTER — Other Ambulatory Visit: Payer: Self-pay | Admitting: Internal Medicine

## 2015-01-07 NOTE — Telephone Encounter (Signed)
Sent to the pharmacy by e-scribe. 

## 2015-01-24 ENCOUNTER — Other Ambulatory Visit: Payer: Self-pay | Admitting: Internal Medicine

## 2015-01-25 NOTE — Telephone Encounter (Signed)
Denied.  Filled in Feb for one year.

## 2015-06-10 ENCOUNTER — Other Ambulatory Visit: Payer: Self-pay | Admitting: Internal Medicine

## 2015-06-10 NOTE — Telephone Encounter (Signed)
Filled on 01/07/15 for 9 months.  Request is too early.

## 2015-06-13 ENCOUNTER — Other Ambulatory Visit: Payer: Self-pay | Admitting: Internal Medicine

## 2015-06-15 NOTE — Telephone Encounter (Signed)
Filled for nine month in March 2016.  Denied.  Request is too soon.

## 2015-09-17 ENCOUNTER — Telehealth: Payer: Self-pay | Admitting: Internal Medicine

## 2015-09-17 MED ORDER — ALBUTEROL SULFATE HFA 108 (90 BASE) MCG/ACT IN AERS: INHALATION_SPRAY | RESPIRATORY_TRACT | Status: DC

## 2015-09-17 NOTE — Telephone Encounter (Signed)
Ok to refill x 2  

## 2015-09-17 NOTE — Telephone Encounter (Signed)
Pt request refill of the following: albuterol (PROVENTIL HFA;VENTOLIN HFA) 108 (90 BASE) MCG/ACT inhaler   Phamacy:  [facility]

## 2015-09-17 NOTE — Telephone Encounter (Signed)
Sent to the pharmacy by e-scribe. 

## 2015-11-24 ENCOUNTER — Other Ambulatory Visit: Payer: Self-pay | Admitting: Internal Medicine

## 2015-11-24 ENCOUNTER — Other Ambulatory Visit: Payer: Self-pay | Admitting: Family Medicine

## 2015-11-24 ENCOUNTER — Telehealth: Payer: Self-pay | Admitting: Family Medicine

## 2015-11-24 DIAGNOSIS — Z0000 Encounter for general adult medical examination without abnormal findings: Secondary | ICD-10-CM

## 2015-11-24 NOTE — Telephone Encounter (Signed)
Pt is due for cpx and lab work in February.  I have placed the lab orders.  Please help the pt to make both appointments.  Thanks!

## 2015-11-24 NOTE — Telephone Encounter (Signed)
Pt has an appt sch for April pt needs Monday

## 2015-11-24 NOTE — Telephone Encounter (Signed)
Sent to the pharmacy by e-scribe.  Message sent to the front desk to help make lab and cpx appointments.

## 2015-12-12 ENCOUNTER — Other Ambulatory Visit: Payer: Self-pay | Admitting: Internal Medicine

## 2015-12-13 NOTE — Telephone Encounter (Signed)
Sent to the pharmacy by e-scribe.  Pt has upcoming cpx on 02/28/16.

## 2016-01-22 ENCOUNTER — Other Ambulatory Visit: Payer: Self-pay | Admitting: Internal Medicine

## 2016-01-24 NOTE — Telephone Encounter (Signed)
Sent to the pharmacy by e-scribe.  Pt has upcoming appt on 02/28/16

## 2016-02-21 ENCOUNTER — Other Ambulatory Visit (INDEPENDENT_AMBULATORY_CARE_PROVIDER_SITE_OTHER): Payer: 59

## 2016-02-21 DIAGNOSIS — Z0000 Encounter for general adult medical examination without abnormal findings: Secondary | ICD-10-CM

## 2016-02-21 LAB — LIPID PANEL
CHOL/HDL RATIO: 6
CHOLESTEROL: 160 mg/dL (ref 0–200)
HDL: 28.9 mg/dL — AB (ref >39.00)
LDL Cholesterol: 109 mg/dL — ABNORMAL HIGH (ref 0–99)
NonHDL: 130.83
Triglycerides: 107 mg/dL (ref 0.0–149.0)
VLDL: 21.4 mg/dL (ref 0.0–40.0)

## 2016-02-21 LAB — HEPATIC FUNCTION PANEL
ALBUMIN: 4 g/dL (ref 3.5–5.2)
ALT: 15 U/L (ref 0–53)
AST: 17 U/L (ref 0–37)
Alkaline Phosphatase: 91 U/L (ref 39–117)
Bilirubin, Direct: 0.1 mg/dL (ref 0.0–0.3)
Total Bilirubin: 0.5 mg/dL (ref 0.2–1.2)
Total Protein: 8.2 g/dL (ref 6.0–8.3)

## 2016-02-21 LAB — CBC WITH DIFFERENTIAL/PLATELET
BASOS ABS: 0 10*3/uL (ref 0.0–0.1)
Basophils Relative: 0.5 % (ref 0.0–3.0)
EOS ABS: 0.3 10*3/uL (ref 0.0–0.7)
Eosinophils Relative: 4.2 % (ref 0.0–5.0)
HCT: 46.5 % (ref 39.0–52.0)
Hemoglobin: 15.5 g/dL (ref 13.0–17.0)
LYMPHS ABS: 2.4 10*3/uL (ref 0.7–4.0)
Lymphocytes Relative: 36.1 % (ref 12.0–46.0)
MCHC: 33.4 g/dL (ref 30.0–36.0)
MCV: 85.4 fl (ref 78.0–100.0)
MONO ABS: 0.5 10*3/uL (ref 0.1–1.0)
MONOS PCT: 8.2 % (ref 3.0–12.0)
NEUTROS ABS: 3.4 10*3/uL (ref 1.4–7.7)
NEUTROS PCT: 51 % (ref 43.0–77.0)
PLATELETS: 199 10*3/uL (ref 150.0–400.0)
RBC: 5.44 Mil/uL (ref 4.22–5.81)
RDW: 13.9 % (ref 11.5–15.5)
WBC: 6.6 10*3/uL (ref 4.0–10.5)

## 2016-02-21 LAB — BASIC METABOLIC PANEL
BUN: 10 mg/dL (ref 6–23)
CALCIUM: 9.3 mg/dL (ref 8.4–10.5)
CO2: 26 meq/L (ref 19–32)
CREATININE: 1 mg/dL (ref 0.40–1.50)
Chloride: 105 mEq/L (ref 96–112)
GFR: 97.47 mL/min (ref >60.00)
GLUCOSE: 118 mg/dL — AB (ref 70–99)
Potassium: 3.8 mEq/L (ref 3.5–5.1)
Sodium: 141 mEq/L (ref 135–145)

## 2016-02-21 LAB — PSA: PSA: 0.6 ng/mL (ref 0.10–4.00)

## 2016-02-21 LAB — TSH: TSH: 0.6 u[IU]/mL (ref 0.35–4.50)

## 2016-02-23 ENCOUNTER — Other Ambulatory Visit: Payer: Self-pay | Admitting: Internal Medicine

## 2016-02-23 NOTE — Telephone Encounter (Signed)
Sent to the pharmacy by e-scribe.  Pt has appt on 02/28/16

## 2016-02-28 ENCOUNTER — Encounter: Payer: Self-pay | Admitting: Internal Medicine

## 2016-02-28 ENCOUNTER — Ambulatory Visit (INDEPENDENT_AMBULATORY_CARE_PROVIDER_SITE_OTHER): Payer: 59 | Admitting: Internal Medicine

## 2016-02-28 VITALS — BP 120/80 | Temp 98.1°F | Ht 73.0 in | Wt 200.0 lb

## 2016-02-28 DIAGNOSIS — E786 Lipoprotein deficiency: Secondary | ICD-10-CM | POA: Diagnosis not present

## 2016-02-28 DIAGNOSIS — J45998 Other asthma: Secondary | ICD-10-CM

## 2016-02-28 DIAGNOSIS — Z0000 Encounter for general adult medical examination without abnormal findings: Secondary | ICD-10-CM | POA: Diagnosis not present

## 2016-02-28 DIAGNOSIS — I480 Paroxysmal atrial fibrillation: Secondary | ICD-10-CM

## 2016-02-28 DIAGNOSIS — I10 Essential (primary) hypertension: Secondary | ICD-10-CM

## 2016-02-28 DIAGNOSIS — E039 Hypothyroidism, unspecified: Secondary | ICD-10-CM | POA: Diagnosis not present

## 2016-02-28 DIAGNOSIS — R739 Hyperglycemia, unspecified: Secondary | ICD-10-CM

## 2016-02-28 DIAGNOSIS — Z1211 Encounter for screening for malignant neoplasm of colon: Secondary | ICD-10-CM

## 2016-02-28 LAB — POCT GLYCOSYLATED HEMOGLOBIN (HGB A1C): Hemoglobin A1C: 6

## 2016-02-28 NOTE — Progress Notes (Signed)
Pre visit review using our clinic review tool, if applicable. No additional management support is needed unless otherwise documented below in the visit note.  Chief Complaint  Patient presents with  . Annual Exam    bp thyroid  hx paf    HPI: Patient  James Nunez  60 y.o. comes in today for Preventive Health Care visit   BP:  At home   135/85 and  lower   I do FINE at home...  Thyroid: ok   Asthma :resp  Fine  Allergy now  Settles in chest.  No sob or wheezing   And gets better  Inhaler .   onece a mointh in season .   Hx of paf :   No sx.    Of significance in the past year   of such   See note cards 2014   No sx  On asas no bleeding  Still no colon  Needs to know within 2 weeks cause of schedule to work it in  No bleeding  GGI   Health Maintenance  Topic Date Due  . Hepatitis C Screening  15-May-1956  . HIV Screening  06/05/1969  . ZOSTAVAX  06/05/2014  . INFLUENZA VACCINE  06/06/2016  . COLONOSCOPY  11/06/2018  . 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  Drug use: no    ROS:  GEN/ HEENT: No fever, significant weight changes sweats headaches vision problems hearing changes, CV/ PULM; No chest pain shortness of breath cough, syncope,edema  change in exercise tolerance. GI /GU: No adominal pain, vomiting, change in bowel habits. No blood in the stool. No significant GU symptoms. SKIN/HEME: ,no acute skin rashes suspicious lesions or bleeding. No lymphadenopathy, nodules, masses.  NEURO/ PSYCH:  No neurologic signs such as weakness numbness. No depression anxiety. IMM/ Allergy: No unusual infections.  Allergy .   REST of 12 system review negative except as per HPI   Past Medical History  Diagnosis Date  . Hypothyroidism     s/p hyperthyroid heart failure and RAI rx  . Low HDL  (under 40)   . Cardiomyopathy     related to hyperthyroid condition  . Asthma     ?  . Umbilical hernia     surgery consult no intervention if no sx   . Allergic rhinitis   . Atrial fibrillation, persistent (HCC)     Paroxysmal    Past Surgical History  Procedure Laterality Date  . No surgical history      Family History  Problem Relation Age of Onset  . Stroke Mother   . Stomach cancer Father   . Diabetes    . Asthma      sibs  . Alcohol abuse      sib    Social History   Social History  . Marital Status: Married    Spouse Name: N/A  . Number of Children: N/A  . Years of Education: N/A   Occupational History  . Truck driver     [place] History Main Topics  . Smoking status: Never Smoker   . Smokeless tobacco: None  . Alcohol Use: No  . Drug Use: No  . Sexual Activity: Not Asked   Other Topics Concern  . None   Social History Narrative  Married, 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 Long Hours. 50 hours per week.   Does physical loading.  [place]     Outpatient Prescriptions Prior to Visit  Medication Sig Dispense Refill  . albuterol (PROVENTIL HFA;VENTOLIN HFA) 108 (90 BASE) MCG/ACT inhaler INHALE 1 TO 2 PUFFS BY MOUTH EVERY 6 HOURS AS NEEDED FOR ASTHMA 8.5 each 1  . aspirin 81 MG tablet Take 81 mg by mouth daily.    . budesonide-formoterol (SYMBICORT) 160-4.5 MCG/ACT inhaler Inhale 2 puffs into the lungs 2 (two) times daily. Prn      . fluticasone (FLONASE) 50 MCG/ACT nasal spray Place 2 sprays into the nose daily. Prn    . levothyroxine (SYNTHROID, LEVOTHROID) 112 MCG tablet TAKE 1 TABLET BY MOUTH EVERY DAY 90 tablet 0  . verapamil (CALAN-SR) 240 MG CR tablet TAKE 1 TABLET BY MOUTH AT BEDTIME 90 tablet 0  . verapamil (CALAN-SR) 120 MG CR tablet TAKE 1/2 TABLET BY MOUTH EVERY DAY 45 tablet 0  . verapamil (CALAN-SR) 240 MG CR tablet Take 1  tablet (240 mg total) by mouth daily. 30 tablet 5  . levothyroxine (SYNTHROID, LEVOTHROID) 112 MCG tablet Take 1 tablet (112 mcg total) by mouth daily. 90 tablet 0  . lisinopril (PRINIVIL,ZESTRIL) 10 MG tablet Take 1 tablet (10 mg total) by mouth daily. 90 tablet 3   No facility-administered medications prior to visit.     EXAM:  BP 120/80 mmHg  Temp(Src) 98.1 F (36.7 C) (Oral)  Ht 6\' 1"  (1.854 m)  Wt 200 lb (90.719 kg)  BMI 26.39 kg/m2  Body mass index is 26.39 kg/(m^2).  Physical Exam: Vital signs reviewed [id number] is a well-developed well-nourished alert cooperative    who appearsr stated age in no acute distress.  HEENT: normocephalic atraumatic , Eyes: PERRL EOM's full, conjunctiva clear, Nares: paten,t no deformity discharge or tenderness., Ears: no deformity EAC's clear TMs with normal landmarks. Mouth: clear OP, no lesions, edema.  Moist mucous membranes. Dentition in adequate repair. NECK: supple without masses, thyromegaly or bruits. CHEST/PULM:  Clear to auscultation and percussion breath sounds equal no wheeze , rales or rhonchi. No chest wall deformities or tenderness. CV: PMI is nondisplaced, S1 S2 no gallops, murmurs, rubs.RR pulse 64  Peripheral pulses are full without delay.No JVD .  ABDOMEN: Bowel sounds normal nontender  No guard or rebound, no hepato splenomegal no CVA tenderness.   Extremtities:  No clubbing cyanosis or edema, no acute joint swelling or redness no focal atrophy NEURO:  Oriented x3, cranial nerves 3-12 appear to be intact, no obvious focal weakness,gait within normal limits no abnormal reflexes or asymmetrical SKIN: No acute rashes normal turgor, color, no bruising or petechiae. PSYCH: Oriented, good eye contact, no obvious depression anxiety, cognition and judgment appear normal. LN: no cervical axillary inguinal adenopathy  Lab Results  Component Value Date   WBC 6.6 02/21/2016   HGB 15.5 02/21/2016   HCT 46.5 02/21/2016   PLT 199.0  02/21/2016   GLUCOSE 118* 02/21/2016   CHOL 160 02/21/2016   TRIG 107.0 02/21/2016   HDL 28.90* 02/21/2016   LDLCALC 109* 02/21/2016   ALT 15 02/21/2016   AST 17 02/21/2016   NA 141 02/21/2016   K 3.8 02/21/2016   CL 105 02/21/2016   CREATININE 1.00 02/21/2016   BUN 10 02/21/2016   CO2 26 02/21/2016   TSH 0.60  02/21/2016   PSA 0.60 02/21/2016   HGBA1C 6.0 02/28/2016   Wt Readings from Last 3 Encounters:  02/28/16 200 lb (90.719 kg)  12/28/14 197 lb (89.359 kg)  12/08/13 202 lb (91.627 kg)    ASSESSMENT AND PLAN:  Discussed the following assessment and plan:  Visit for preventive health examination  Hyperglycemia - fam hx dm  a1c  prediabetes at risk  lsi and follow - Plan: POC HgB A1c  Low HDL (under 40)  Hypothyroidism, unspecified hypothyroidism type - in range   Essential hypertension - check better on repeat readings  Paroxysmal atrial fibrillation (HCC)  Seasonal asthma - stable no sig flares requireing ed visits hosp  Colon cancer screening - Plan: Fecal occult blood, imunochemical See below   At risk of developing diabetes  Truck driver fam hx   But is not  Obese  Disc metformin lsi for now  Patient Care Team: Burnis Medin, MD as PCP - General Leonie Man, MD as Consulting Physician (Cardiology) Patient Instructions   Stool cards for colon cancer screening  Until you can set up for colonoscopy.  Continue lifestyle intervention healthy eating and exercise . We can have  Your see nutritionist  If   You have time for the  Blood sugar and cholesterol issues.  Take blood pressure readings twice a day for 7- 10 days and then periodically .To ensure below 140/90   .Send in readings   If elevated   m y chart or phone message . Will consider ation of blood thinning meds to prevent stroke   Risk benefit  .     Asa every day may be helpful but if you get diabetes or when reach age of 5 risk goes up  At this time CHA2DS2VASc score is 1 and  Yearly risk of  stroke 0.6% per year above baseline.   So aspirin should be   Best benefit risk   Health Maintenance, Male A healthy lifestyle and preventative care can promote health and wellness.  Maintain regular health, dental, and eye exams.  Eat a healthy diet. Foods like vegetables, fruits, whole grains, low-fat dairy products, and lean protein foods contain the nutrients you need and are low in calories. Decrease your intake of foods high in solid fats, added sugars, and salt. Get information about a proper diet from your health care provider, if necessary.  Regular physical exercise is one of the most important things you can do for your health. Most adults should get at least 150 minutes of moderate-intensity exercise (any activity that increases your heart rate and causes you to sweat) each week. In addition, most adults need muscle-strengthening exercises on 2 or more days a week.   Maintain a healthy weight. The body mass index (BMI) is a screening tool to identify possible weight problems. It provides an estimate of body fat based on height and weight. Your health care provider can find your BMI and can help you achieve or maintain a healthy weight. For males 20 years and older:  A BMI below 18.5 is considered underweight.  A BMI of 18.5 to 24.9 is normal.  A BMI of 25 to 29.9 is considered overweight.  A BMI of 30 and above is considered obese.  Maintain normal blood lipids and cholesterol by exercising and minimizing your intake of saturated fat. Eat a balanced diet with plenty of fruits and vegetables. Blood tests for lipids and cholesterol should begin at age 65 and be repeated every 5  years. If your lipid or cholesterol levels are high, you are over age 22, or you are at high risk for heart disease, you may need your cholesterol levels checked more frequently.Ongoing high lipid and cholesterol levels should be treated with medicines if diet and exercise are not working.  If you smoke,  find out from your health care provider how to quit. If you do not use tobacco, do not start.  Lung cancer screening is recommended for adults aged 43-80 years who are at high risk for developing lung cancer because of a history of smoking. A yearly low-dose CT scan of the lungs is recommended for people who have at least a 30-pack-year history of smoking and are current smokers or have quit within the past 15 years. A pack year of smoking is smoking an average of 1 pack of cigarettes a day for 1 year (for example, a 30-pack-year history of smoking could mean smoking 1 pack a day for 30 years or 2 packs a day for 15 years). Yearly screening should continue until the smoker has stopped smoking for at least 15 years. Yearly screening should be stopped for people who develop a health problem that would prevent them from having lung cancer treatment.  If you choose to drink alcohol, do not have more than 2 drinks per day. One drink is considered to be 12 oz (360 mL) of beer, 5 oz (150 mL) of wine, or 1.5 oz (45 mL) of liquor.  Avoid the use of street drugs. Do not share needles with anyone. Ask for help if you need support or instructions about stopping the use of drugs.  High blood pressure causes heart disease and increases the risk of stroke. High blood pressure is more likely to develop in:  People who have blood pressure in the end of the normal range (100-139/85-89 mm Hg).  People who are overweight or obese.  People who are African American.  If you are 57-36 years of age, have your blood pressure checked every 3-5 years. If you are 51 years of age or older, have your blood pressure checked every year. You should have your blood pressure measured twice--once when you are at a hospital or clinic, and once when you are not at a hospital or clinic. Record the average of the two measurements. To check your blood pressure when you are not at a hospital or clinic, you can use:  An automated blood  pressure machine at a pharmacy.  A home blood pressure monitor.  If you are 68-23 years old, ask your health care provider if you should take aspirin to prevent heart disease.  Diabetes screening involves taking a blood sample to check your fasting blood sugar level. This should be done once every 3 years after age 49 if you are at a normal weight and without risk factors for diabetes. Testing should be considered at a younger age or be carried out more frequently if you are overweight and have at least 1 risk factor for diabetes.  Colorectal cancer can be detected and often prevented. Most routine colorectal cancer screening begins at the age of 17 and continues through age 50. However, your health care provider may recommend screening at an earlier age if you have risk factors for colon cancer. On a yearly basis, your health care provider may provide home test kits to check for hidden blood in the stool. A small camera at the end of a tube may be used to directly examine the  colon (sigmoidoscopy or colonoscopy) to detect the earliest forms of colorectal cancer. Talk to your health care provider about this at age 37 when routine screening begins. A direct exam of the colon should be repeated every 5-10 years through age 54, unless early forms of precancerous polyps or small growths are found.  People who are at an increased risk for hepatitis B should be screened for this virus. You are considered at high risk for hepatitis B if:  You were born in a country where hepatitis B occurs often. Talk with your health care provider about which countries are considered high risk.  Your parents were born in a high-risk country and you have not received a shot to protect against hepatitis B (hepatitis B vaccine).  You have HIV or AIDS.  You use needles to inject street drugs.  You live with, or have sex with, someone who has hepatitis B.  You are a man who has sex with other men (MSM).  You get  hemodialysis treatment.  You take certain medicines for conditions like cancer, organ transplantation, and autoimmune conditions.  Hepatitis C blood testing is recommended for all people born from [birth year] through 1967 and any individual with known risk factors for hepatitis C.  Healthy men should no longer receive prostate-specific antigen (PSA) blood tests as part of routine cancer screening. Talk to your health care provider about prostate cancer screening.  Testicular cancer screening is not recommended for adolescents or adult males who have no symptoms. Screening includes self-exam, a health care provider exam, and other screening tests. Consult with your health care provider about any symptoms you have or any concerns you have about testicular cancer.  Practice safe sex. Use condoms and avoid high-risk sexual practices to reduce the spread of sexually transmitted infections (STIs).  You should be screened for STIs, including gonorrhea and chlamydia if:  You are sexually active and are younger than 24 years.  You are older than 24 years, and your health care provider tells you that you are at risk for this type of infection.  Your sexual activity has changed since you were last screened, and you are at an increased risk for chlamydia or gonorrhea. Ask your health care provider if you are at risk.  If you are at risk of being infected with HIV, it is recommended that you take a prescription medicine daily to prevent HIV infection. This is called pre-exposure prophylaxis (PrEP). You are considered at risk if:  You are a man who has sex with other men (MSM).  You are a heterosexual man who is sexually active with multiple partners.  You take drugs by injection.  You are sexually active with a partner who has HIV.  Talk with your health care provider about whether you are at high risk of being infected with HIV. If you choose to begin PrEP, you should first be tested for HIV. You should  then be tested every 3 months for as long as you are taking PrEP.  Use sunscreen. Apply sunscreen liberally and repeatedly throughout the day. You should seek shade when your shadow is shorter than you. Protect yourself by wearing long sleeves, pants, a wide-brimmed hat, and sunglasses year round whenever you are outdoors.  Tell your health care provider of new moles or changes in moles, especially if there is a change in shape or color. Also, tell your health care provider if a mole is larger than the size of a pencil eraser.  A one-time screening  for abdominal aortic aneurysm (AAA) and surgical repair of large AAAs by ultrasound is recommended for men aged 85-75 years who are current or former smokers.  Stay current with your vaccines (immunizations).   This information is not intended to replace advice given to you by your health care provider. Make sure you discuss any questions you have with your health care provider.   Document Released: 04/20/2008 Document Revised: 11/13/2014 Document Reviewed: 03/20/2011 Elsevier Interactive Patient Education [employer].      Why follow it? Research shows. . Those who follow the Mediterranean diet have a reduced risk of heart disease  . The diet is associated with a reduced incidence of Parkinson's and Alzheimer's diseases . People following the diet may have longer life expectancies and lower rates of chronic diseases  . The Dietary Guidelines for Americans recommends the Mediterranean diet as an eating plan to promote health and prevent disease  What Is the Mediterranean Diet?  . Healthy eating plan based on typical foods and recipes of Mediterranean-style cooking . The diet is primarily a plant based diet; these foods should make up a majority of meals   Starches - Plant based foods should make up a majority of meals - They are an important sources of vitamins, minerals, energy, antioxidants, and fiber - Choose whole grains, foods high  in fiber and minimally processed items  - Typical grain sources include wheat, oats, barley, corn, brown rice, bulgar, farro, millet, polenta, couscous  - Various types of beans include chickpeas, lentils, fava beans, black beans, white beans   Fruits  Veggies - Large quantities of antioxidant rich fruits & veggies; 6 or more servings  - Vegetables can be eaten raw or lightly drizzled with oil and cooked  - Vegetables common to the traditional Mediterranean Diet include: artichokes, arugula, beets, broccoli, brussel sprouts, cabbage, carrots, celery, collard greens, cucumbers, eggplant, kale, leeks, lemons, lettuce, mushrooms, okra, onions, peas, peppers, potatoes, pumpkin, radishes, rutabaga, shallots, spinach, sweet potatoes, turnips, zucchini - Fruits common to the Mediterranean Diet include: apples, apricots, avocados, cherries, clementines, dates, figs, grapefruits, grapes, melons, nectarines, oranges, peaches, pears, pomegranates, strawberries, tangerines  Fats - Replace butter and margarine with healthy oils, such as olive oil, canola oil, and tahini  - Limit nuts to no more than a handful a day  - Nuts include walnuts, almonds, pecans, pistachios, pine nuts  - Limit or avoid candied, honey roasted or heavily salted nuts - Olives are central to the [employer] - can be eaten whole or used in a variety of dishes   Meats Protein - Limiting red meat: no more than a few times a month - When eating red meat: choose lean cuts and keep the portion to the size of deck of cards - Eggs: approx. 0 to 4 times a week  - Fish and lean poultry: at least 2 a week  - Healthy protein sources include, chicken, [place], lean beef, lamb - Increase intake of seafood such as tuna, salmon, trout, mackerel, shrimp, scallops - Avoid or limit high fat processed meats such as sausage and bacon  Dairy - Include moderate amounts of low fat dairy products  - Focus on healthy dairy such as fat free yogurt, skim  milk, low or reduced fat cheese - Limit dairy products higher in fat such as whole or 2% milk, cheese, ice cream  Alcohol - Moderate amounts of red wine is ok  - No more than 5 oz daily for women (all ages) and  men older than age 92  - No more than 10 oz of wine daily for men younger than 19  Other - Limit sweets and other desserts  - Use herbs and spices instead of salt to flavor foods  - Herbs and spices common to the traditional Mediterranean Diet include: basil, bay leaves, chives, cloves, cumin, fennel, garlic, lavender, marjoram, mint, oregano, parsley, pepper, rosemary, sage, savory, sumac, tarragon, thyme   It's not just a diet, it's a lifestyle:  . The Mediterranean diet includes lifestyle factors typical of those in the region  . Foods, drinks and meals are best eaten with others and savored . Daily physical activity is important for overall good health . This could be strenuous exercise like running and aerobics . This could also be more leisurely activities such as walking, housework, yard-work, or taking the stairs . Moderation is the key; a balanced and healthy diet accommodates most foods and drinks . Consider portion sizes and frequency of consumption of certain foods   Meal Ideas & Options:  . Breakfast:  o Whole wheat toast or whole wheat English muffins with peanut butter & hard boiled egg o Steel cut oats topped with apples & cinnamon and skim milk  o Fresh fruit: banana, strawberries, melon, berries, peaches  o Smoothies: strawberries, bananas, greek yogurt, peanut butter o Low fat greek yogurt with blueberries and granola  o Egg white omelet with spinach and mushrooms o Breakfast couscous: whole wheat couscous, apricots, skim milk, cranberries  . Sandwiches:  o Hummus and grilled vegetables (peppers, zucchini, squash) on whole wheat bread   o Grilled chicken on whole wheat pita with lettuce, tomatoes, cucumbers or tzatziki  o Tuna salad on whole wheat bread: tuna  salad made with greek yogurt, olives, red peppers, capers, green onions o Garlic rosemary lamb pita: lamb sauted with garlic, rosemary, salt & pepper; add lettuce, cucumber, greek yogurt to pita - flavor with lemon juice and black pepper  . Seafood:  o Mediterranean grilled salmon, seasoned with garlic, basil, parsley, lemon juice and black pepper o Shrimp, lemon, and spinach whole-grain pasta salad made with low fat greek yogurt  o Seared scallops with lemon orzo  o Seared tuna steaks seasoned salt, pepper, coriander topped with tomato mixture of olives, tomatoes, olive oil, minced garlic, parsley, green onions and cappers  . Meats:  o Herbed greek chicken salad with kalamata olives, cucumber, feta  o Red bell peppers stuffed with spinach, bulgur, lean ground beef (or lentils) & topped with feta   o Kebabs: skewers of chicken, tomatoes, onions, zucchini, squash  o [place] burgers: made with red onions, mint, dill, lemon juice, feta cheese topped with roasted red peppers . Vegetarian o Cucumber salad: cucumbers, artichoke hearts, celery, red onion, feta cheese, tossed in olive oil & lemon juice  o Hummus and whole grain pita points with a greek salad (lettuce, tomato, feta, olives, cucumbers, red onion) o Lentil soup with celery, carrots made with vegetable broth, garlic, salt and pepper  o Tabouli salad: parsley, bulgur, mint, scallions, cucumbers, tomato, radishes, lemon juice, olive oil, salt and pepper.                 Standley Brooking. Calani Gick M.D.

## 2016-02-28 NOTE — Patient Instructions (Addendum)
Stool cards for colon cancer screening  Until you can set up for colonoscopy.  Continue lifestyle intervention healthy eating and exercise . We can have  Your see nutritionist  If   You have time for the  Blood sugar and cholesterol issues.  Take blood pressure readings twice a day for 7- 10 days and then periodically .To ensure below 140/90   .Send in readings   If elevated   m y chart or phone message . Will consider ation of blood thinning meds to prevent stroke   Risk benefit  .     Asa every day may be helpful but if you get diabetes or when reach age of 17 risk goes up  At this time CHA2DS2VASc score is 1 and  Yearly risk of stroke 0.6% per year above baseline.   So aspirin should be   Best benefit risk   Health Maintenance, Male A healthy lifestyle and preventative care can promote health and wellness.  Maintain regular health, dental, and eye exams.  Eat a healthy diet. Foods like vegetables, fruits, whole grains, low-fat dairy products, and lean protein foods contain the nutrients you need and are low in calories. Decrease your intake of foods high in solid fats, added sugars, and salt. Get information about a proper diet from your health care provider, if necessary.  Regular physical exercise is one of the most important things you can do for your health. Most adults should get at least 150 minutes of moderate-intensity exercise (any activity that increases your heart rate and causes you to sweat) each week. In addition, most adults need muscle-strengthening exercises on 2 or more days a week.   Maintain a healthy weight. The body mass index (BMI) is a screening tool to identify possible weight problems. It provides an estimate of body fat based on height and weight. Your health care provider can find your BMI and can help you achieve or maintain a healthy weight. For males 20 years and older:  A BMI below 18.5 is considered underweight.  A BMI of 18.5 to 24.9 is normal.  A BMI of 25  to 29.9 is considered overweight.  A BMI of 30 and above is considered obese.  Maintain normal blood lipids and cholesterol by exercising and minimizing your intake of saturated fat. Eat a balanced diet with plenty of fruits and vegetables. Blood tests for lipids and cholesterol should begin at age 70 and be repeated every 5 years. If your lipid or cholesterol levels are high, you are over age 77, or you are at high risk for heart disease, you may need your cholesterol levels checked more frequently.Ongoing high lipid and cholesterol levels should be treated with medicines if diet and exercise are not working.  If you smoke, find out from your health care provider how to quit. If you do not use tobacco, do not start.  Lung cancer screening is recommended for adults aged 20-80 years who are at high risk for developing lung cancer because of a history of smoking. A yearly low-dose CT scan of the lungs is recommended for people who have at least a 30-pack-year history of smoking and are current smokers or have quit within the past 15 years. A pack year of smoking is smoking an average of 1 pack of cigarettes a day for 1 year (for example, a 30-pack-year history of smoking could mean smoking 1 pack a day for 30 years or 2 packs a day for 15 years). Yearly screening should  continue until the smoker has stopped smoking for at least 15 years. Yearly screening should be stopped for people who develop a health problem that would prevent them from having lung cancer treatment.  If you choose to drink alcohol, do not have more than 2 drinks per day. One drink is considered to be 12 oz (360 mL) of beer, 5 oz (150 mL) of wine, or 1.5 oz (45 mL) of liquor.  Avoid the use of street drugs. Do not share needles with anyone. Ask for help if you need support or instructions about stopping the use of drugs.  High blood pressure causes heart disease and increases the risk of stroke. High blood pressure is more likely to  develop in:  People who have blood pressure in the end of the normal range (100-139/85-89 mm Hg).  People who are overweight or obese.  People who are African American.  If you are 50-98 years of age, have your blood pressure checked every 3-5 years. If you are 52 years of age or older, have your blood pressure checked every year. You should have your blood pressure measured twice--once when you are at a hospital or clinic, and once when you are not at a hospital or clinic. Record the average of the two measurements. To check your blood pressure when you are not at a hospital or clinic, you can use:  An automated blood pressure machine at a pharmacy.  A home blood pressure monitor.  If you are 4-50 years old, ask your health care provider if you should take aspirin to prevent heart disease.  Diabetes screening involves taking a blood sample to check your fasting blood sugar level. This should be done once every 3 years after age 60 if you are at a normal weight and without risk factors for diabetes. Testing should be considered at a younger age or be carried out more frequently if you are overweight and have at least 1 risk factor for diabetes.  Colorectal cancer can be detected and often prevented. Most routine colorectal cancer screening begins at the age of 51 and continues through age 96. However, your health care provider may recommend screening at an earlier age if you have risk factors for colon cancer. On a yearly basis, your health care provider may provide home test kits to check for hidden blood in the stool. A small camera at the end of a tube may be used to directly examine the colon (sigmoidoscopy or colonoscopy) to detect the earliest forms of colorectal cancer. Talk to your health care provider about this at age 42 when routine screening begins. A direct exam of the colon should be repeated every 5-10 years through age 74, unless early forms of precancerous polyps or small growths  are found.  People who are at an increased risk for hepatitis B should be screened for this virus. You are considered at high risk for hepatitis B if:  You were born in a country where hepatitis B occurs often. Talk with your health care provider about which countries are considered high risk.  Your parents were born in a high-risk country and you have not received a shot to protect against hepatitis B (hepatitis B vaccine).  You have HIV or AIDS.  You use needles to inject street drugs.  You live with, or have sex with, someone who has hepatitis B.  You are a man who has sex with other men (MSM).  You get hemodialysis treatment.  You take certain medicines  for conditions like cancer, organ transplantation, and autoimmune conditions.  Hepatitis C blood testing is recommended for all people born from [birth year] through 1967 and any individual with known risk factors for hepatitis C.  Healthy men should no longer receive prostate-specific antigen (PSA) blood tests as part of routine cancer screening. Talk to your health care provider about prostate cancer screening.  Testicular cancer screening is not recommended for adolescents or adult males who have no symptoms. Screening includes self-exam, a health care provider exam, and other screening tests. Consult with your health care provider about any symptoms you have or any concerns you have about testicular cancer.  Practice safe sex. Use condoms and avoid high-risk sexual practices to reduce the spread of sexually transmitted infections (STIs).  You should be screened for STIs, including gonorrhea and chlamydia if:  You are sexually active and are younger than 24 years.  You are older than 24 years, and your health care provider tells you that you are at risk for this type of infection.  Your sexual activity has changed since you were last screened, and you are at an increased risk for chlamydia or gonorrhea. Ask your health care provider  if you are at risk.  If you are at risk of being infected with HIV, it is recommended that you take a prescription medicine daily to prevent HIV infection. This is called pre-exposure prophylaxis (PrEP). You are considered at risk if:  You are a man who has sex with other men (MSM).  You are a heterosexual man who is sexually active with multiple partners.  You take drugs by injection.  You are sexually active with a partner who has HIV.  Talk with your health care provider about whether you are at high risk of being infected with HIV. If you choose to begin PrEP, you should first be tested for HIV. You should then be tested every 3 months for as long as you are taking PrEP.  Use sunscreen. Apply sunscreen liberally and repeatedly throughout the day. You should seek shade when your shadow is shorter than you. Protect yourself by wearing long sleeves, pants, a wide-brimmed hat, and sunglasses year round whenever you are outdoors.  Tell your health care provider of new moles or changes in moles, especially if there is a change in shape or color. Also, tell your health care provider if a mole is larger than the size of a pencil eraser.  A one-time screening for abdominal aortic aneurysm (AAA) and surgical repair of large AAAs by ultrasound is recommended for men aged 37-75 years who are current or former smokers.  Stay current with your vaccines (immunizations).   This information is not intended to replace advice given to you by your health care provider. Make sure you discuss any questions you have with your health care provider.   Document Released: 04/20/2008 Document Revised: 11/13/2014 Document Reviewed: 03/20/2011 Elsevier Interactive Patient Education [employer].      Why follow it? Research shows. . Those who follow the Mediterranean diet have a reduced risk of heart disease  . The diet is associated with a reduced incidence of Parkinson's and Alzheimer's  diseases . People following the diet may have longer life expectancies and lower rates of chronic diseases  . The Dietary Guidelines for Americans recommends the Mediterranean diet as an eating plan to promote health and prevent disease  What Is the Mediterranean Diet?  . Healthy eating plan based on typical foods and recipes of Mediterranean-style cooking .  The diet is primarily a plant based diet; these foods should make up a majority of meals   Starches - Plant based foods should make up a majority of meals - They are an important sources of vitamins, minerals, energy, antioxidants, and fiber - Choose whole grains, foods high in fiber and minimally processed items  - Typical grain sources include wheat, oats, barley, corn, brown rice, bulgar, farro, millet, polenta, couscous  - Various types of beans include chickpeas, lentils, fava beans, black beans, white beans   Fruits  Veggies - Large quantities of antioxidant rich fruits & veggies; 6 or more servings  - Vegetables can be eaten raw or lightly drizzled with oil and cooked  - Vegetables common to the traditional Mediterranean Diet include: artichokes, arugula, beets, broccoli, brussel sprouts, cabbage, carrots, celery, collard greens, cucumbers, eggplant, kale, leeks, lemons, lettuce, mushrooms, okra, onions, peas, peppers, potatoes, pumpkin, radishes, rutabaga, shallots, spinach, sweet potatoes, turnips, zucchini - Fruits common to the Mediterranean Diet include: apples, apricots, avocados, cherries, clementines, dates, figs, grapefruits, grapes, melons, nectarines, oranges, peaches, pears, pomegranates, strawberries, tangerines  Fats - Replace butter and margarine with healthy oils, such as olive oil, canola oil, and tahini  - Limit nuts to no more than a handful a day  - Nuts include walnuts, almonds, pecans, pistachios, pine nuts  - Limit or avoid candied, honey roasted or heavily salted nuts - Olives are central to the [employer] - can be eaten whole or used in a variety of dishes   Meats Protein - Limiting red meat: no more than a few times a month - When eating red meat: choose lean cuts and keep the portion to the size of deck of cards - Eggs: approx. 0 to 4 times a week  - Fish and lean poultry: at least 2 a week  - Healthy protein sources include, chicken, [place], lean beef, lamb - Increase intake of seafood such as tuna, salmon, trout, mackerel, shrimp, scallops - Avoid or limit high fat processed meats such as sausage and bacon  Dairy - Include moderate amounts of low fat dairy products  - Focus on healthy dairy such as fat free yogurt, skim milk, low or reduced fat cheese - Limit dairy products higher in fat such as whole or 2% milk, cheese, ice cream  Alcohol - Moderate amounts of red wine is ok  - No more than 5 oz daily for women (all ages) and men older than age 58  - No more than 10 oz of wine daily for men younger than 55  Other - Limit sweets and other desserts  - Use herbs and spices instead of salt to flavor foods  - Herbs and spices common to the traditional Mediterranean Diet include: basil, bay leaves, chives, cloves, cumin, fennel, garlic, lavender, marjoram, mint, oregano, parsley, pepper, rosemary, sage, savory, sumac, tarragon, thyme   It's not just a diet, it's a lifestyle:  . The Mediterranean diet includes lifestyle factors typical of those in the region  . Foods, drinks and meals are best eaten with others and savored . Daily physical activity is important for overall good health . This could be strenuous exercise like running and aerobics . This could also be more leisurely activities such as walking, housework, yard-work, or taking the stairs . Moderation is the key; a balanced and healthy diet accommodates most foods and drinks . Consider portion sizes and frequency of consumption of certain foods   Meal Ideas & Options:  .  Breakfast:  o Whole wheat toast or whole wheat English  muffins with peanut butter & hard boiled egg o Steel cut oats topped with apples & cinnamon and skim milk  o Fresh fruit: banana, strawberries, melon, berries, peaches  o Smoothies: strawberries, bananas, greek yogurt, peanut butter o Low fat greek yogurt with blueberries and granola  o Egg white omelet with spinach and mushrooms o Breakfast couscous: whole wheat couscous, apricots, skim milk, cranberries  . Sandwiches:  o Hummus and grilled vegetables (peppers, zucchini, squash) on whole wheat bread   o Grilled chicken on whole wheat pita with lettuce, tomatoes, cucumbers or tzatziki  o Tuna salad on whole wheat bread: tuna salad made with greek yogurt, olives, red peppers, capers, green onions o Garlic rosemary lamb pita: lamb sauted with garlic, rosemary, salt & pepper; add lettuce, cucumber, greek yogurt to pita - flavor with lemon juice and black pepper  . Seafood:  o Mediterranean grilled salmon, seasoned with garlic, basil, parsley, lemon juice and black pepper o Shrimp, lemon, and spinach whole-grain pasta salad made with low fat greek yogurt  o Seared scallops with lemon orzo  o Seared tuna steaks seasoned salt, pepper, coriander topped with tomato mixture of olives, tomatoes, olive oil, minced garlic, parsley, green onions and cappers  . Meats:  o Herbed greek chicken salad with kalamata olives, cucumber, feta  o Red bell peppers stuffed with spinach, bulgur, lean ground beef (or lentils) & topped with feta   o Kebabs: skewers of chicken, tomatoes, onions, zucchini, squash  o [place] burgers: made with red onions, mint, dill, lemon juice, feta cheese topped with roasted red peppers . Vegetarian o Cucumber salad: cucumbers, artichoke hearts, celery, red onion, feta cheese, tossed in olive oil & lemon juice  o Hummus and whole grain pita points with a greek salad (lettuce, tomato, feta, olives, cucumbers, red onion) o Lentil soup with celery, carrots made with vegetable broth,  garlic, salt and pepper  o Tabouli salad: parsley, bulgur, mint, scallions, cucumbers, tomato, radishes, lemon juice, olive oil, salt and pepper.

## 2016-03-09 ENCOUNTER — Other Ambulatory Visit: Payer: Self-pay | Admitting: Internal Medicine

## 2016-03-10 NOTE — Telephone Encounter (Signed)
Sent to the pharmacy by e-scribe. 

## 2016-03-27 ENCOUNTER — Ambulatory Visit (INDEPENDENT_AMBULATORY_CARE_PROVIDER_SITE_OTHER): Payer: 59 | Admitting: Internal Medicine

## 2016-03-27 ENCOUNTER — Encounter: Payer: Self-pay | Admitting: Internal Medicine

## 2016-03-27 VITALS — BP 140/90 | HR 68 | Temp 98.6°F | Wt 199.0 lb

## 2016-03-27 DIAGNOSIS — R05 Cough: Secondary | ICD-10-CM

## 2016-03-27 DIAGNOSIS — J4521 Mild intermittent asthma with (acute) exacerbation: Secondary | ICD-10-CM

## 2016-03-27 DIAGNOSIS — R053 Chronic cough: Secondary | ICD-10-CM

## 2016-03-27 DIAGNOSIS — J45998 Other asthma: Secondary | ICD-10-CM

## 2016-03-27 MED ORDER — HYDROCODONE-HOMATROPINE 5-1.5 MG/5ML PO SYRP: ORAL_SOLUTION | ORAL | Status: DC

## 2016-03-27 MED ORDER — PREDNISONE 20 MG PO TABS: ORAL_TABLET | ORAL | Status: DC

## 2016-03-27 MED ORDER — FLUTICASONE FUROATE-VILANTEROL 100-25 MCG/INH IN AEPB: 1.0000 | INHALATION_SPRAY | Freq: Every day | RESPIRATORY_TRACT | Status: DC

## 2016-03-27 NOTE — Patient Instructions (Addendum)
I agre the cough is allergic triggered. Prednisone for 5 days  Begin controller inhaler for another month and if doing ok can wean off   If coughing again then restart and fu visit .   Cough med for comfort . Contact team if fever  Worsening  Concerns  .  May want to use inhaler  And mask before you mow the grass.

## 2016-03-27 NOTE — Progress Notes (Signed)
Chief Complaint  Patient presents with  . Cough    HPI: James Nunez 60 y.o.  Patient James Nunez  comes in today for SDA for  new problem evaluation. Onset  3.5 weeks opf sx cough and dry NP cough  Onset after mowing th grass in hot weather  .develoepd cough and never went away and in spasm   Esp at night   No fever hemoptysis  Infection sx  . Allergy thinking is triggering bronchitis  No fever. Cp from coughing .  Inhaler   Using   Symbicort is outdated and not on controller for quite a while   Cough is presistent  Dry.  Wheeze coughing fits at night  About 2 weeks.  ROS: See pertinent positives and negatives per HPI. No vomiting diarrhea rash swelling hard to stop coughing with fits   Past Medical History  Diagnosis Date  . Hypothyroidism     s/p hyperthyroid heart failure and RAI rx  . Low HDL (under 40)   . Cardiomyopathy     related to hyperthyroid condition  . Asthma     ?  . Umbilical hernia     surgery consult no intervention if no sx   . Allergic rhinitis   . Atrial fibrillation, persistent (HCC)     Paroxysmal    Family History  Problem Relation Age of Onset  . Stroke Mother   . Stomach cancer Father   . Diabetes    . Asthma      sibs  . Alcohol abuse      sib    Social History   Social History  . Marital Status: Married    Spouse Name: N/A  . Number of Children: N/A  . Years of Education: N/A   Occupational History  . Truck driver     [place] History Main Topics  . Smoking status: Never Smoker   . Smokeless tobacco: None  . Alcohol Use: No  . Drug Use: No  . Sexual Activity: Not Asked   Other Topics Concern  . None   Social History Narrative   Married, 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 Long Hours. 50 hours per week.   Does physical loading.  [place]     Outpatient Prescriptions Prior to Visit  Medication Sig  Dispense Refill  . albuterol (PROVENTIL HFA;VENTOLIN HFA) 108 (90 BASE) MCG/ACT inhaler INHALE 1 TO 2 PUFFS BY MOUTH EVERY 6 HOURS AS NEEDED FOR ASTHMA 8.5 each 1  . aspirin 81 MG tablet Take 81 mg by mouth daily.    . fluticasone (FLONASE) 50 MCG/ACT nasal spray Place 2 sprays into the nose daily. Prn    . levothyroxine (SYNTHROID, LEVOTHROID) 112 MCG tablet TAKE 1 TABLET BY MOUTH EVERY DAY 90 tablet 0  . verapamil (CALAN-SR) 240 MG CR tablet TAKE 1 TABLET BY MOUTH AT BEDTIME 90 tablet 1  . budesonide-formoterol (SYMBICORT) 160-4.5 MCG/ACT inhaler Inhale 2 puffs into the lungs 2 (two) times daily. Prn       No facility-administered medications prior to visit.     EXAM:  BP 140/90 mmHg  Pulse 68  Temp(Src) 98.6 F (37 C) (Oral)  Wt 199 lb (90.266 kg)  SpO2 94%  Body mass index is 26.26 kg/(m^2).  GENERAL: vitals reviewed and listed above, alert, oriented, appears well hydrated and in  no acute distress intermittent coughing fits   No resp distress  HEENT: atraumatic, conjunctiva  clear, no obvious abnormalities on inspection of external nose and ears midl congestion face ntOP : no lesion edema or exudate  NECK: no obvious masses on inspection palpation  LUNGS:  midly tight  But bs = few end exp wheeze no rhinchi  Retraction all lung fields =  CV: HRRR, no clubbing cyanosis or  peripheral edema nl cap refill  MS: moves all extremities without noticeable focal  abnormality PSYCH: pleasant and cooperative, no obvious depression or anxiety  ASSESSMENT AND PLAN:  Discussed the following assessment and plan:  Cough, persistent  Acute asthma flare, mild intermittent  Seasonal asthma Offered nebulizer says doesn't  think needs it not that bad.   Counseled. Plan pred and then controller   breo  For a month and then can wean if controlled  If not on formulary will change  This   Seek care for emergent care  Cough med  As planned. -Patient advised to return or notify health care team   if symptoms worsen ,persist or new concerns arise.  Patient Instructions  I agre the cough is allergic triggered. Prednisone for 5 days  Begin controller inhaler for another month and if doing ok can wean off   If coughing again then restart and fu visit .   Cough med for comfort . Contact team if fever  Worsening  Concerns  .  May want to use inhaler  And mask before you mow the grass.       Standley Brooking. Kirandeep Fariss M.D.

## 2016-03-27 NOTE — Progress Notes (Signed)
Pre visit review using our clinic review tool, if applicable. No additional management support is needed unless otherwise documented below in the visit note. 

## 2016-04-22 ENCOUNTER — Other Ambulatory Visit: Payer: Self-pay | Admitting: Internal Medicine

## 2016-04-24 NOTE — Telephone Encounter (Signed)
FILLED ON 03/10/2016 FOR 6 MONTHS.  REQUEST IS TOO EARLY

## 2016-04-25 ENCOUNTER — Telehealth: Payer: Self-pay | Admitting: Internal Medicine

## 2016-04-25 MED ORDER — VERAPAMIL HCL ER 120 MG PO TBCR: 60.0000 mg | EXTENDED_RELEASE_TABLET | Freq: Every day | ORAL | Status: DC

## 2016-04-25 NOTE — Telephone Encounter (Addendum)
Pt states he takes 2 kinds of verapamil One is verapamil (CALAN-SR) 120 MG CR tablet  The other is a 240 mg  The 120 mg was not filled. Can you send 90 w/1 refill to [facility]

## 2016-04-25 NOTE — Telephone Encounter (Signed)
Sent to the pharmacy by e-scribe. 

## 2016-05-20 ENCOUNTER — Other Ambulatory Visit: Payer: Self-pay | Admitting: Internal Medicine

## 2016-05-23 NOTE — Telephone Encounter (Signed)
Sent to the pharmacy by e-scribe. 

## 2016-05-29 ENCOUNTER — Other Ambulatory Visit: Payer: 59

## 2016-05-30 ENCOUNTER — Other Ambulatory Visit: Payer: 59

## 2016-05-30 ENCOUNTER — Other Ambulatory Visit (INDEPENDENT_AMBULATORY_CARE_PROVIDER_SITE_OTHER): Payer: 59

## 2016-05-30 DIAGNOSIS — C189 Malignant neoplasm of colon, unspecified: Secondary | ICD-10-CM

## 2016-05-31 LAB — FECAL OCCULT BLOOD, IMMUNOCHEMICAL: FECAL OCCULT BLD: NEGATIVE

## 2016-05-31 NOTE — Progress Notes (Signed)
Inform patient stool test negative for blood .

## 2016-08-28 ENCOUNTER — Ambulatory Visit (INDEPENDENT_AMBULATORY_CARE_PROVIDER_SITE_OTHER): Payer: 59 | Admitting: Internal Medicine

## 2016-08-28 DIAGNOSIS — R7301 Impaired fasting glucose: Secondary | ICD-10-CM | POA: Diagnosis not present

## 2016-08-28 DIAGNOSIS — Z23 Encounter for immunization: Secondary | ICD-10-CM

## 2016-08-28 DIAGNOSIS — J45998 Other asthma: Secondary | ICD-10-CM

## 2016-08-28 DIAGNOSIS — Z79899 Other long term (current) drug therapy: Secondary | ICD-10-CM | POA: Diagnosis not present

## 2016-08-28 DIAGNOSIS — I10 Essential (primary) hypertension: Secondary | ICD-10-CM

## 2016-08-28 LAB — POCT GLYCOSYLATED HEMOGLOBIN (HGB A1C): Hemoglobin A1C: 5.8

## 2016-08-29 ENCOUNTER — Ambulatory Visit: Payer: 59 | Admitting: Internal Medicine

## 2016-08-31 NOTE — Patient Instructions (Addendum)
See scanned note and plan CPX in with labs in 6 months and A1c.

## 2016-08-31 NOTE — Progress Notes (Signed)
Internet was down day of this visit and thus documentation was done in the written form. He was here for follow-up of medications blood pressure asthma thyroid. Has been doing well with no asthma attacks and blood pressures been controlled at home and no episodes of what could be A. fib. He is taking aspirin no bleeding. For vital signs and physical exam see scanned document. CPX with labs and hemoglobin A1c in 6 months area there is a history of borderline elevated blood sugar. Good today. Lottie Dawson, MD Essential hypertension - Repeat blood pressure was 138/84 right arm.  Fasting hyperglycemia - Plan: POCT A1C  Need for prophylactic vaccination and inoculation against influenza - Plan: Flu Vaccine QUAD 36+ mos PF IM (Fluarix & Fluzone Quad PF)  Medication management  Seasonal asthma    Lab Results  Component Value Date   WBC 6.6 02/21/2016   HGB 15.5 02/21/2016   HCT 46.5 02/21/2016   PLT 199.0 02/21/2016   GLUCOSE 118 (H) 02/21/2016   CHOL 160 02/21/2016   TRIG 107.0 02/21/2016   HDL 28.90 (L) 02/21/2016   LDLCALC 109 (H) 02/21/2016   ALT 15 02/21/2016   AST 17 02/21/2016   NA 141 02/21/2016   K 3.8 02/21/2016   CL 105 02/21/2016   CREATININE 1.00 02/21/2016   BUN 10 02/21/2016   CO2 26 02/21/2016   TSH 0.60 02/21/2016   PSA 0.60 02/21/2016   HGBA1C 5.8 08/28/2016

## 2016-09-05 ENCOUNTER — Other Ambulatory Visit: Payer: Self-pay | Admitting: Internal Medicine

## 2016-09-07 NOTE — Telephone Encounter (Signed)
Sent to the pharmacy by e-scribe for 6 months.  Pt to return in 6 months. 

## 2016-10-25 ENCOUNTER — Other Ambulatory Visit: Payer: Self-pay | Admitting: Internal Medicine

## 2016-10-25 NOTE — Telephone Encounter (Signed)
Sent to the pharmacy by e-scribe. 

## 2016-11-16 ENCOUNTER — Other Ambulatory Visit: Payer: Self-pay | Admitting: Internal Medicine

## 2016-11-16 NOTE — Telephone Encounter (Signed)
Sent to the pharmacy by e-scribe for 6 months.  Pt has upcoming cpx on 04/09/17.

## 2016-11-21 ENCOUNTER — Other Ambulatory Visit: Payer: Self-pay | Admitting: Internal Medicine

## 2016-11-21 NOTE — Telephone Encounter (Signed)
Ok to refill x 2  

## 2017-02-26 ENCOUNTER — Other Ambulatory Visit: Payer: 59

## 2017-03-05 ENCOUNTER — Encounter: Payer: 59 | Admitting: Internal Medicine

## 2017-03-12 ENCOUNTER — Other Ambulatory Visit: Payer: Self-pay

## 2017-03-12 ENCOUNTER — Other Ambulatory Visit: Payer: Self-pay | Admitting: Internal Medicine

## 2017-03-26 ENCOUNTER — Other Ambulatory Visit: Payer: 59

## 2017-04-08 NOTE — Progress Notes (Signed)
Chief Complaint  Patient presents with  . Annual Exam    HPI: Patient  James Nunez  61 y.o. comes in today for Preventive Health Care visit  And Chronic disease management    Spring allergies and trigger   Needs refill albuterol.   Used cough med   Not on breo at this time    bp up some at home   140/90   Not this high usually  On med    Not sure.   of having a fib   .       Rare  Not long lasting  Daily bms used to constipation  2 x per week but no pain blood etc  Colon screen cards   Health Maintenance  Topic Date Due  . Hepatitis C Screening  04/09/2018 (Originally Apr 01, 1956)  . HIV Screening  04/09/2018 (Originally 06/05/1971)  . INFLUENZA VACCINE  06/06/2017  . COLONOSCOPY  11/06/2018  . TETANUS/TDAP  11/06/2018   Health Maintenance Review LIFESTYLE:  Exercise:   soso  Tobacco/ETS:no Alcohol:  no Sugar beverages: juices  OJ  2 x per week.  Sleep:  6 hours  Drug use: no HH of  1  No pets .  Work:  Full time  40 +    Day . 5 days   ROS: Umbi hernia the same no sx  GEN/ HEENT: No fever, significant weight changes sweats headaches vision problems hearing changes, CV/ PULM; No chest pain shortness of breath cough, syncope,edema  change in exercise tolerance. GI /GU: No adominal pain, vomiting, s. No blood in the stool. No significant GU symptoms. SKIN/HEME: ,no acute skin rashes suspicious lesions or bleeding. No lymphadenopathy, nodules, masses.  NEURO/ PSYCH:  No neurologic signs such as weakness numbness. No depression anxiety. IMM/ Allergy: No unusual infections.  Allergy .   REST of 12 system review negative except as per HPI   Past Medical History:  Diagnosis Date  . Allergic rhinitis   . Asthma    ?  Marland Kitchen Atrial fibrillation, persistent (HCC)    Paroxysmal  . 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 Hypothyroidism    s/p hyperthyroid heart failure and RAI rx  . Low HDL (under 40)   . Umbilical hernia    surgery consult no  intervention if no sx     Past Surgical History:  Procedure Laterality Date  . no surgical history      Family History  Problem Relation Age of Onset  . Stroke Mother   . Stomach cancer Father   . Diabetes Unknown   . Asthma Unknown        sibs  . Alcohol abuse Unknown        sib    Social History   Social History  . Marital status: Married    Spouse name: N/A  . Number of children: N/A  . Years of education: N/A   Occupational History  . [employer]     [place] History Main Topics  . Smoking status: Never Smoker  . Smokeless tobacco: Never Used  . Alcohol use No  . Drug use: No  . Sexual activity: Not Asked   Other Topics Concern  . None   Social History Narrative   Married, 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 Long Hours. 50 hours  per week.   Does physical loading.  [place]     Outpatient Medications Prior to Visit  Medication Sig Dispense Refill  . aspirin 81 MG tablet Take 81 mg by mouth daily.    . fluticasone (FLONASE) 50 MCG/ACT nasal spray Place 2 sprays into the nose daily. Prn    . levothyroxine (SYNTHROID, LEVOTHROID) 112 MCG tablet TAKE 1 TABLET BY MOUTH EVERY DAY 90 tablet 1  . verapamil (CALAN-SR) 240 MG CR tablet TAKE 1 TABLET BY MOUTH AT BEDTIME 90 tablet 3  . fluticasone furoate-vilanterol (BREO ELLIPTA) 100-25 MCG/INH AEPB Inhale 1 puff into the lungs daily. Controller inhaler. 1 each 6  . HYDROcodone-homatropine (HYCODAN) 5-1.5 MG/5ML syrup 1 tsp at night or every 4-6 hours if needed for cough 180 mL 0  . predniSONE (DELTASONE) 20 MG tablet Take 3 po qd for 2 days then 2 po qd for 3 days,or as directed 12 tablet 0  . VENTOLIN HFA 108 (90 Base) MCG/ACT inhaler INHALE 1 TO 2 PUFFS BY MOUTH EVERY 6 HOURS AS NEEDED FOR ASTHMA 18 Inhaler 1   No facility-administered medications prior to visit.      EXAM:  BP (!)  150/100 (BP Location: Right Arm, Patient Position: Sitting, Cuff Size: Normal)   Pulse (!) 47   Temp 97.9 F (36.6 C) (Oral)   Ht 6\' 1"  (1.854 m)   Wt 203 lb 3.2 oz (92.2 kg)   BMI 26.81 kg/m   Body mass index is 26.81 kg/m. Wt Readings from Last 3 Encounters:  04/09/17 203 lb 3.2 oz (92.2 kg)  03/27/16 199 lb (90.3 kg)  02/28/16 200 lb (90.7 kg)    Physical Exam: Vital signs reviewed RDE:YCXK is a well-developed well-nourished alert cooperative    who appearsr stated age in no acute distress. Mild ur congestion  ocass cough  HEENT: normocephalic atraumatic , Eyes: PERRL EOM's full, conjunctiva clear, Nares: paten,t no deformity discharge or tenderness., Ears: no deformity EAC's clear TMs with normal landmarks. Mouth: clear OP, no lesions, edema.  Moist mucous membranes. Dentition in adequate repair. NECK: supple without masses, thyromegaly or bruits. CHEST/PULM:  Clear to auscultation and percussion breath sounds equal no wheeze , rales or rhonchi. No chest wall deformities or tenderness. Breast: normal by inspection . No dimpling, discharge, masses, tenderness or discharge . CV: PMI is nondisplaced, S1 S2 no gallops, murmurs, rubs. Peripheral pulses are full without delay.No JVD .  ABDOMEN: Bowel sounds normal nontender  No guard or rebound, no hepato splenomegal no CVA tenderness.  Small umbi hernia hernia. Reducible 2 cm  Extremtities:  No clubbing cyanosis or edema, no acute joint swelling or redness no focal atrophy NEURO:  Oriented x3, cranial nerves 3-12 appear to be intact, no obvious focal weakness,gait within normal limits no abnormal reflexes or asymmetrical SKIN: No acute rashes normal turgor, color, no bruising or petechiae. PSYCH: Oriented, good eye contact, no obvious depression anxiety, cognition and judgment appear normal. LN: no cervical axillary inguinal adenopathy    BP Readings from Last 3 Encounters:  04/09/17 (!) 150/100  03/27/16 140/90  02/28/16 120/80      Lab results reviewed with patient   ASSESSMENT AND PLAN:  Discussed the following assessment and plan:  Visit for preventive health examination - Plan: Basic metabolic panel, CBC with Differential/Platelet, Hemoglobin A1c, Hepatic function panel, Lipid panel, TSH, PSA  Paroxysmal atrial fibrillation (HCC) - infrequent chadds  score 1   Low HDL (under 40) - Plan: Basic metabolic panel, CBC  with Differential/Platelet, Hemoglobin A1c, Hepatic function panel, Lipid panel, TSH, PSA  Hypothyroidism, unspecified type - check tsh today  - Plan: Basic metabolic panel, CBC with Differential/Platelet, Hemoglobin A1c, Hepatic function panel, Lipid panel, TSH, PSA  Fasting hyperglycemia - Plan: Basic metabolic panel, CBC with Differential/Platelet, Hemoglobin A1c, Hepatic function panel, Lipid panel, TSH, PSA  Uncomplicated asthma, unspecified asthma severity, unspecified whether persistent - Reviewed strategy again get back on controller medicine in season including nasal cortisone in inhaler very - Plan: Basic metabolic panel, CBC with Differential/Platelet, Hemoglobin A1c, Hepatic function panel, Lipid panel, TSH, PSA  Medication management - Plan: Basic metabolic panel, CBC with Differential/Platelet, Hemoglobin A1c, Hepatic function panel, Lipid panel, TSH, PSA  Screening PSA (prostate specific antigen) - Plan: PSA  Screen for colon cancer - check in to cologuard coverage vx ifob  - Plan: Fecal occult blood, imunochemical  Essential hypertension - Intensification of treatment discussed add low dose Maxide half pill  Shared Decision Making  Patient Care Team: Cortavious Nix, Standley Brooking, MD as PCP - General Leonie Man, MD as Consulting Physician (Cardiology) Patient Instructions   Take controller inhaler in seasn every day as well as flonase.  Will refill the breo and albuterol.  Newer blood pressure goals is below 135/85. We should intensify blood pressure medicine treatment. Continue on  the verapamil and add low dose  Diuretic  Check with the insurance about the new shingles vaccine: Single recs. Colon cancer screening we can need to do the yearly stool cards or better considering cologuard check with your insurance if it is covered and we can do the paperwork to order the test.  If progressive change in stool habits such as blood in your stool diarrhea etc. contact us and we'll get a GI consult. However this time I don't see alarming problem.  ROV in 2-3 months  Or as needed for BP evaluation and  Asthma  Fu.     Health Maintenance, Male A healthy lifestyle and preventive care is important for your health and wellness. Ask your health care provider about what schedule of regular examinations is right for you. What should I know about weight and diet? Eat a Healthy Diet  Eat plenty of vegetables, fruits, whole grains, low-fat dairy products, and lean protein.  Do not eat a lot of foods high in solid fats, added sugars, or salt.  Maintain a Healthy Weight Regular exercise can help you achieve or maintain a healthy weight. You should:  Do at least 150 minutes of exercise each week. The exercise should increase your heart rate and make you sweat (moderate-intensity exercise).  Do strength-training exercises at least twice a week.  Watch Your Levels of Cholesterol and Blood Lipids  Have your blood tested for lipids and cholesterol every 5 years starting at 63 years of age. If you are at high risk for heart disease, you should start having your blood tested when you are 63 years old. You may need to have your cholesterol levels checked more often if: ? Your lipid or cholesterol levels are high. ? You are older than 63 years of age. ? You are at high risk for heart disease.  What should I know about cancer screening? Many types of cancers can be detected early and may often be prevented. Lung Cancer  You should be screened every year for lung cancer if: ? You are a  current smoker who has smoked for at least 30 years. ? You are a former smoker who has  quit within the past 15 years.  Talk to your health care provider about your screening options, when you should start screening, and how often you should be screened.  Colorectal Cancer  Routine colorectal cancer screening usually begins at 63 years of age and should be repeated every 5-10 years until you are 63 years old. You may need to be screened more often if early forms of precancerous polyps or small growths are found. Your health care provider may recommend screening at an earlier age if you have risk factors for colon cancer.  Your health care provider may recommend using home test kits to check for hidden blood in the stool.  A small camera at the end of a tube can be used to examine your colon (sigmoidoscopy or colonoscopy). This checks for the earliest forms of colorectal cancer.  Prostate and Testicular Cancer  Depending on your age and overall health, your health care provider may do certain tests to screen for prostate and testicular cancer.  Talk to your health care provider about any symptoms or concerns you have about testicular or prostate cancer.  Skin Cancer  Check your skin from head to toe regularly.  Tell your health care provider about any new moles or changes in moles, especially if: ? There is a change in a mole's size, shape, or color. ? You have a mole that is larger than a pencil eraser.  Always use sunscreen. Apply sunscreen liberally and repeat throughout the day.  Protect yourself by wearing long sleeves, pants, a wide-brimmed hat, and sunglasses when outside.  What should I know about heart disease, diabetes, and high blood pressure?  If you are 49-73 years of age, have your blood pressure checked every 3-5 years. If you are 63 years of age or older, have your blood pressure checked every year. You should have your blood pressure measured twice-once when you are at  a hospital or clinic, and once when you are not at a hospital or clinic. Record the average of the two measurements. To check your blood pressure when you are not at a hospital or clinic, you can use: ? An automated blood pressure machine at a pharmacy. ? A home blood pressure monitor.  Talk to your health care provider about your target blood pressure.  If you are between 78-66 years old, ask your health care provider if you should take aspirin to prevent heart disease.  Have regular diabetes screenings by checking your fasting blood sugar level. ? If you are at a normal weight and have a low risk for diabetes, have this test once every three years after the age of 12. ? If you are overweight and have a high risk for diabetes, consider being tested at a younger age or more often.  A one-time screening for abdominal aortic aneurysm (AAA) by ultrasound is recommended for men aged 29-75 years who are current or former smokers. What should I know about preventing infection? Hepatitis B If you have a higher risk for hepatitis B, you should be screened for this virus. Talk with your health care provider to find out if you are at risk for hepatitis B infection. Hepatitis C Blood testing is recommended for:  Everyone born from [birth year] through 1967.  Anyone with known risk factors for hepatitis C.  Sexually Transmitted Diseases (STDs)  You should be screened each year for STDs including gonorrhea and chlamydia if: ? You are sexually active and are younger than 63 years of age. ? You  are older than 63 years of age and your health care provider tells you that you are at risk for this type of infection. ? Your sexual activity has changed since you were last screened and you are at an increased risk for chlamydia or gonorrhea. Ask your health care provider if you are at risk.  Talk with your health care provider about whether you are at high risk of being infected with HIV. Your health care provider  may recommend a prescription medicine to help prevent HIV infection.  What else can I do?  Schedule regular health, dental, and eye exams.  Stay current with your vaccines (immunizations).  Do not use any tobacco products, such as cigarettes, chewing tobacco, and e-cigarettes. If you need help quitting, ask your health care provider.  Limit alcohol intake to no more than 2 drinks per day. One drink equals 12 ounces of beer, 5 ounces of wine, or 1 ounces of hard liquor.  Do not use street drugs.  Do not share needles.  Ask your health care provider for help if you need support or information about quitting drugs.  Tell your health care provider if you often feel depressed.  Tell your health care provider if you have ever been abused or do not feel safe at home. This information is not intended to replace advice given to you by your health care provider. Make sure you discuss any questions you have with your health care provider. Document Released: 04/20/2008 Document Revised: 06/21/2016 Document Reviewed: 07/27/2015 Elsevier Interactive Patient Education  2018 [place]. Lela Gell M.D. Lab Results  Component Value Date   WBC 5.5 04/09/2017   HGB 14.8 04/09/2017   HCT 44.7 04/09/2017   PLT 177.0 04/09/2017   GLUCOSE 98 04/09/2017   CHOL 150 04/09/2017   TRIG 94.0 04/09/2017   HDL 30.30 (L) 04/09/2017   LDLCALC 101 (H) 04/09/2017   ALT 12 04/09/2017   AST 16 04/09/2017   NA 140 04/09/2017   K 3.8 04/09/2017   CL 104 04/09/2017   CREATININE 0.91 04/09/2017   BUN 9 04/09/2017   CO2 30 04/09/2017   TSH 0.21 (L) 04/09/2017   PSA 0.57 04/09/2017   HGBA1C 6.2 04/09/2017

## 2017-04-09 ENCOUNTER — Ambulatory Visit (INDEPENDENT_AMBULATORY_CARE_PROVIDER_SITE_OTHER): Payer: 59 | Admitting: Internal Medicine

## 2017-04-09 ENCOUNTER — Encounter: Payer: Self-pay | Admitting: Internal Medicine

## 2017-04-09 VITALS — BP 150/100 | HR 47 | Temp 97.9°F | Ht 73.0 in | Wt 203.2 lb

## 2017-04-09 DIAGNOSIS — I10 Essential (primary) hypertension: Secondary | ICD-10-CM | POA: Diagnosis not present

## 2017-04-09 DIAGNOSIS — Z125 Encounter for screening for malignant neoplasm of prostate: Secondary | ICD-10-CM | POA: Diagnosis not present

## 2017-04-09 DIAGNOSIS — Z79899 Other long term (current) drug therapy: Secondary | ICD-10-CM | POA: Diagnosis not present

## 2017-04-09 DIAGNOSIS — Z1211 Encounter for screening for malignant neoplasm of colon: Secondary | ICD-10-CM | POA: Diagnosis not present

## 2017-04-09 DIAGNOSIS — Z0000 Encounter for general adult medical examination without abnormal findings: Secondary | ICD-10-CM | POA: Diagnosis not present

## 2017-04-09 DIAGNOSIS — E039 Hypothyroidism, unspecified: Secondary | ICD-10-CM

## 2017-04-09 DIAGNOSIS — R7301 Impaired fasting glucose: Secondary | ICD-10-CM | POA: Diagnosis not present

## 2017-04-09 DIAGNOSIS — E786 Lipoprotein deficiency: Secondary | ICD-10-CM | POA: Diagnosis not present

## 2017-04-09 DIAGNOSIS — I480 Paroxysmal atrial fibrillation: Secondary | ICD-10-CM | POA: Diagnosis not present

## 2017-04-09 DIAGNOSIS — J45909 Unspecified asthma, uncomplicated: Secondary | ICD-10-CM

## 2017-04-09 LAB — LIPID PANEL
CHOL/HDL RATIO: 5
Cholesterol: 150 mg/dL (ref 0–200)
HDL: 30.3 mg/dL — ABNORMAL LOW (ref >39.00)
LDL CALC: 101 mg/dL — AB (ref 0–99)
NONHDL: 119.49
Triglycerides: 94 mg/dL (ref 0.0–149.0)
VLDL: 18.8 mg/dL (ref 0.0–40.0)

## 2017-04-09 LAB — CBC WITH DIFFERENTIAL/PLATELET
BASOS ABS: 0 10*3/uL (ref 0.0–0.1)
Basophils Relative: 0.5 % (ref 0.0–3.0)
Eosinophils Absolute: 0.2 10*3/uL (ref 0.0–0.7)
Eosinophils Relative: 3.5 % (ref 0.0–5.0)
HEMATOCRIT: 44.7 % (ref 39.0–52.0)
HEMOGLOBIN: 14.8 g/dL (ref 13.0–17.0)
LYMPHS PCT: 32.2 % (ref 12.0–46.0)
Lymphs Abs: 1.8 10*3/uL (ref 0.7–4.0)
MCHC: 33.1 g/dL (ref 30.0–36.0)
MCV: 86.3 fl (ref 78.0–100.0)
MONOS PCT: 8.8 % (ref 3.0–12.0)
Monocytes Absolute: 0.5 10*3/uL (ref 0.1–1.0)
NEUTROS ABS: 3 10*3/uL (ref 1.4–7.7)
Neutrophils Relative %: 55 % (ref 43.0–77.0)
PLATELETS: 177 10*3/uL (ref 150.0–400.0)
RBC: 5.19 Mil/uL (ref 4.22–5.81)
RDW: 13.9 % (ref 11.5–15.5)
WBC: 5.5 10*3/uL (ref 4.0–10.5)

## 2017-04-09 LAB — BASIC METABOLIC PANEL
BUN: 9 mg/dL (ref 6–23)
CALCIUM: 9.1 mg/dL (ref 8.4–10.5)
CO2: 30 meq/L (ref 19–32)
Chloride: 104 mEq/L (ref 96–112)
Creatinine, Ser: 0.91 mg/dL (ref 0.40–1.50)
GFR: 108.27 mL/min (ref >60.00)
Glucose, Bld: 98 mg/dL (ref 70–99)
Potassium: 3.8 mEq/L (ref 3.5–5.1)
SODIUM: 140 meq/L (ref 135–145)

## 2017-04-09 LAB — HEMOGLOBIN A1C: Hgb A1c MFr Bld: 6.2 % (ref 4.6–6.5)

## 2017-04-09 LAB — HEPATIC FUNCTION PANEL
ALT: 12 U/L (ref 0–53)
AST: 16 U/L (ref 0–37)
Albumin: 3.8 g/dL (ref 3.5–5.2)
Alkaline Phosphatase: 80 U/L (ref 39–117)
BILIRUBIN DIRECT: 0.1 mg/dL (ref 0.0–0.3)
TOTAL PROTEIN: 7.6 g/dL (ref 6.0–8.3)
Total Bilirubin: 0.5 mg/dL (ref 0.2–1.2)

## 2017-04-09 LAB — PSA: PSA: 0.57 ng/mL (ref 0.10–4.00)

## 2017-04-09 LAB — TSH: TSH: 0.21 u[IU]/mL — ABNORMAL LOW (ref 0.35–4.50)

## 2017-04-09 MED ORDER — TRIAMTERENE-HCTZ 37.5-25 MG PO TABS: 0.5000 | ORAL_TABLET | Freq: Every day | ORAL | 3 refills | Status: DC

## 2017-04-09 MED ORDER — FLUTICASONE FUROATE-VILANTEROL 100-25 MCG/INH IN AEPB: 1.0000 | INHALATION_SPRAY | Freq: Every day | RESPIRATORY_TRACT | 11 refills | Status: DC

## 2017-04-09 MED ORDER — ALBUTEROL SULFATE HFA 108 (90 BASE) MCG/ACT IN AERS: INHALATION_SPRAY | RESPIRATORY_TRACT | 3 refills | Status: DC

## 2017-04-09 NOTE — Assessment & Plan Note (Signed)
Blood pressures been creeping up into the 1 4150 range. Discussion at half dose Maxide into addition to his verapamil lifestyle changes follow-up in 2-3 months or as needed.

## 2017-04-09 NOTE — Assessment & Plan Note (Addendum)
reports rare symptoms and transient. Lab today to reassess risk. chadds vas 2 1

## 2017-04-09 NOTE — Assessment & Plan Note (Signed)
Checking TSH today maintain on medication.

## 2017-04-09 NOTE — Patient Instructions (Addendum)
Take controller inhaler in seasn every day as well as flonase.  Will refill the breo and albuterol.  Newer blood pressure goals is below 135/85. We should intensify blood pressure medicine treatment. Continue on the verapamil and add low dose  Diuretic  Check with the insurance about the new shingles vaccine: Single recs. Colon cancer screening we can need to do the yearly stool cards or better considering cologuard check with your insurance if it is covered and we can do the paperwork to order the test.  If progressive change in stool habits such as blood in your stool diarrhea etc. contact us and we'll get a GI consult. However this time I don't see alarming problem.  ROV in 2-3 months  Or as needed for BP evaluation and  Asthma  Fu.     Health Maintenance, Male A healthy lifestyle and preventive care is important for your health and wellness. Ask your health care provider about what schedule of regular examinations is right for you. What should I know about weight and diet? Eat a Healthy Diet  Eat plenty of vegetables, fruits, whole grains, low-fat dairy products, and lean protein.  Do not eat a lot of foods high in solid fats, added sugars, or salt.  Maintain a Healthy Weight Regular exercise can help you achieve or maintain a healthy weight. You should:  Do at least 150 minutes of exercise each week. The exercise should increase your heart rate and make you sweat (moderate-intensity exercise).  Do strength-training exercises at least twice a week.  Watch Your Levels of Cholesterol and Blood Lipids  Have your blood tested for lipids and cholesterol every 5 years starting at 63 years of age. If you are at high risk for heart disease, you should start having your blood tested when you are 63 years old. You may need to have your cholesterol levels checked more often if: ? Your lipid or cholesterol levels are high. ? You are older than 63 years of age. ? You are at high risk for  heart disease.  What should I know about cancer screening? Many types of cancers can be detected early and may often be prevented. Lung Cancer  You should be screened every year for lung cancer if: ? You are a current smoker who has smoked for at least 30 years. ? You are a former smoker who has quit within the past 15 years.  Talk to your health care provider about your screening options, when you should start screening, and how often you should be screened.  Colorectal Cancer  Routine colorectal cancer screening usually begins at 63 years of age and should be repeated every 5-10 years until you are 62 years old. You may need to be screened more often if early forms of precancerous polyps or small growths are found. Your health care provider may recommend screening at an earlier age if you have risk factors for colon cancer.  Your health care provider may recommend using home test kits to check for hidden blood in the stool.  A small camera at the end of a tube can be used to examine your colon (sigmoidoscopy or colonoscopy). This checks for the earliest forms of colorectal cancer.  Prostate and Testicular Cancer  Depending on your age and overall health, your health care provider may do certain tests to screen for prostate and testicular cancer.  Talk to your health care provider about any symptoms or concerns you have about testicular or prostate cancer.  Skin  Cancer  Check your skin from head to toe regularly.  Tell your health care provider about any new moles or changes in moles, especially if: ? There is a change in a mole's size, shape, or color. ? You have a mole that is larger than a pencil eraser.  Always use sunscreen. Apply sunscreen liberally and repeat throughout the day.  Protect yourself by wearing long sleeves, pants, a wide-brimmed hat, and sunglasses when outside.  What should I know about heart disease, diabetes, and high blood pressure?  If you are 85-30  years of age, have your blood pressure checked every 3-5 years. If you are 36 years of age or older, have your blood pressure checked every year. You should have your blood pressure measured twice-once when you are at a hospital or clinic, and once when you are not at a hospital or clinic. Record the average of the two measurements. To check your blood pressure when you are not at a hospital or clinic, you can use: ? An automated blood pressure machine at a pharmacy. ? A home blood pressure monitor.  Talk to your health care provider about your target blood pressure.  If you are between 36-44 years old, ask your health care provider if you should take aspirin to prevent heart disease.  Have regular diabetes screenings by checking your fasting blood sugar level. ? If you are at a normal weight and have a low risk for diabetes, have this test once every three years after the age of 63. ? If you are overweight and have a high risk for diabetes, consider being tested at a younger age or more often.  A one-time screening for abdominal aortic aneurysm (AAA) by ultrasound is recommended for men aged 74-75 years who are current or former smokers. What should I know about preventing infection? Hepatitis B If you have a higher risk for hepatitis B, you should be screened for this virus. Talk with your health care provider to find out if you are at risk for hepatitis B infection. Hepatitis C Blood testing is recommended for:  Everyone born from [birth year] through 1967.  Anyone with known risk factors for hepatitis C.  Sexually Transmitted Diseases (STDs)  You should be screened each year for STDs including gonorrhea and chlamydia if: ? You are sexually active and are younger than 63 years of age. ? You are older than 63 years of age and your health care provider tells you that you are at risk for this type of infection. ? Your sexual activity has changed since you were last screened and you are at an  increased risk for chlamydia or gonorrhea. Ask your health care provider if you are at risk.  Talk with your health care provider about whether you are at high risk of being infected with HIV. Your health care provider may recommend a prescription medicine to help prevent HIV infection.  What else can I do?  Schedule regular health, dental, and eye exams.  Stay current with your vaccines (immunizations).  Do not use any tobacco products, such as cigarettes, chewing tobacco, and e-cigarettes. If you need help quitting, ask your health care provider.  Limit alcohol intake to no more than 2 drinks per day. One drink equals 12 ounces of beer, 5 ounces of wine, or 1 ounces of hard liquor.  Do not use street drugs.  Do not share needles.  Ask your health care provider for help if you need support or information about quitting drugs.  Tell your health care provider if you often feel depressed.  Tell your health care provider if you have ever been abused or do not feel safe at home. This information is not intended to replace advice given to you by your health care provider. Make sure you discuss any questions you have with your health care provider. Document Released: 04/20/2008 Document Revised: 06/21/2016 Document Reviewed: 07/27/2015 Elsevier Interactive Patient Education  [employer].

## 2017-04-11 ENCOUNTER — Other Ambulatory Visit: Payer: Self-pay | Admitting: Emergency Medicine

## 2017-04-11 ENCOUNTER — Telehealth: Payer: Self-pay | Admitting: Internal Medicine

## 2017-04-11 DIAGNOSIS — I10 Essential (primary) hypertension: Secondary | ICD-10-CM

## 2017-04-11 DIAGNOSIS — E039 Hypothyroidism, unspecified: Secondary | ICD-10-CM

## 2017-04-11 NOTE — Telephone Encounter (Signed)
Pt would like blood work results °

## 2017-04-11 NOTE — Telephone Encounter (Signed)
Spoke with pt nothing further needed  

## 2017-04-19 ENCOUNTER — Other Ambulatory Visit: Payer: Self-pay | Admitting: Internal Medicine

## 2017-04-19 ENCOUNTER — Other Ambulatory Visit: Payer: Self-pay | Admitting: Emergency Medicine

## 2017-04-19 MED ORDER — VERAPAMIL HCL ER 240 MG PO TBCR: 240.0000 mg | EXTENDED_RELEASE_TABLET | Freq: Every day | ORAL | 3 refills | Status: DC

## 2017-04-21 ENCOUNTER — Other Ambulatory Visit: Payer: Self-pay | Admitting: Internal Medicine

## 2017-04-25 ENCOUNTER — Telehealth: Payer: Self-pay | Admitting: Internal Medicine

## 2017-04-25 ENCOUNTER — Other Ambulatory Visit: Payer: Self-pay | Admitting: Emergency Medicine

## 2017-04-25 DIAGNOSIS — Z1212 Encounter for screening for malignant neoplasm of rectum: Secondary | ICD-10-CM

## 2017-04-25 DIAGNOSIS — Z1211 Encounter for screening for malignant neoplasm of colon: Secondary | ICD-10-CM

## 2017-04-25 NOTE — Telephone Encounter (Signed)
Pt was seen 04-09-17 and has checked with his insurance and they will pay for cologuard test

## 2017-04-27 NOTE — Telephone Encounter (Signed)
Spoke to patient and asked him if he could come by the office to sign the cologuard form and he stated that he would come by Monday and sign

## 2017-05-18 ENCOUNTER — Other Ambulatory Visit: Payer: Self-pay | Admitting: Emergency Medicine

## 2017-05-18 ENCOUNTER — Telehealth: Payer: Self-pay | Admitting: Internal Medicine

## 2017-05-18 MED ORDER — VALSARTAN 160 MG PO TABS: 160.0000 mg | ORAL_TABLET | Freq: Every day | ORAL | 0 refills | Status: DC

## 2017-05-18 NOTE — Telephone Encounter (Signed)
° °  Pt said the below med was messing with him sexually so he stop taking the med and has been montoring his bp and it is running around the 140/90       triamterene-hydrochlorothiazide (MAXZIDE-25) 37.5-25 MG tablet

## 2017-05-18 NOTE — Telephone Encounter (Signed)
[place]   Stay off that med   We can try  Valsartan 160 mg  1 po qd   Disp 30   # contact us in a month about how doing   Stay on the verapamil

## 2017-05-18 NOTE — Telephone Encounter (Signed)
FYI

## 2017-05-18 NOTE — Telephone Encounter (Signed)
Spoke with patient and sent his prescription to the pharmacy

## 2017-05-18 NOTE — Telephone Encounter (Signed)
Left a VM for patient to give the office a call back.  

## 2017-05-22 ENCOUNTER — Other Ambulatory Visit: Payer: Self-pay | Admitting: Family Medicine

## 2017-05-22 MED ORDER — LEVOTHYROXINE SODIUM 112 MCG PO TABS: 112.0000 ug | ORAL_TABLET | Freq: Every day | ORAL | 1 refills | Status: DC

## 2017-06-13 ENCOUNTER — Other Ambulatory Visit: Payer: Self-pay | Admitting: Internal Medicine

## 2017-06-22 ENCOUNTER — Telehealth: Payer: Self-pay | Admitting: Internal Medicine

## 2017-06-22 NOTE — Telephone Encounter (Signed)
Error

## 2017-06-26 ENCOUNTER — Telehealth: Payer: Self-pay | Admitting: Emergency Medicine

## 2017-06-26 NOTE — Telephone Encounter (Addendum)
Begin candesartan 16 mg per day disp 30 refill x 2  Take valsartan off med list

## 2017-06-26 NOTE — Telephone Encounter (Signed)
Pharmacy is requesting an alternative for patient Valsartan since it has been recalled. Please advise.

## 2017-06-27 ENCOUNTER — Other Ambulatory Visit: Payer: Self-pay | Admitting: Emergency Medicine

## 2017-06-27 MED ORDER — CANDESARTAN CILEXETIL 16 MG PO TABS: 16.0000 mg | ORAL_TABLET | Freq: Every day | ORAL | 2 refills | Status: DC

## 2017-06-27 NOTE — Telephone Encounter (Signed)
Spoke with patient regarding medication bing on the recall list. Patient understood to stop taking medication and will start the candesartan. Nothing further needed

## 2017-06-27 NOTE — Telephone Encounter (Signed)
Left a VM for patient to give the office a call back regarding Valsartan recalled. Candesartan as been sent in to patient pharmacy

## 2017-07-02 ENCOUNTER — Other Ambulatory Visit: Payer: 59

## 2017-07-03 ENCOUNTER — Telehealth: Payer: Self-pay | Admitting: Internal Medicine

## 2017-07-03 NOTE — Telephone Encounter (Signed)
Spoke with patient and gave Dr. Regis Bill recommendations on calling insurance to see which medication they will cover. Patient will give the office a call back.

## 2017-07-03 NOTE — Telephone Encounter (Signed)
Please advise 

## 2017-07-03 NOTE — Telephone Encounter (Signed)
Need to ask insurance  Pharmacy plan which ARBs are preferred and less expensive  To be able to answer the ?s

## 2017-07-03 NOTE — Telephone Encounter (Signed)
° °  Pt call to say the below med he can not afford and is asking if there is something else that can be called in    candesartan (ATACAND) 16 MG tablet    [place]

## 2017-07-13 ENCOUNTER — Ambulatory Visit: Payer: 59 | Admitting: Internal Medicine

## 2017-08-22 ENCOUNTER — Other Ambulatory Visit: Payer: Self-pay | Admitting: Internal Medicine

## 2017-08-23 ENCOUNTER — Telehealth: Payer: Self-pay | Admitting: Internal Medicine

## 2017-08-23 NOTE — Telephone Encounter (Signed)
Medication was changed d/t recall to [place] 16mg  and is up to date with refills.   Burnis Medin, MD   06/26/17  Note 6:15 PM  Begin candesartan 16 mg per day disp 30 refill x 2  Take valsartan off med list

## 2017-08-23 NOTE — Telephone Encounter (Signed)
° ° ° °  Pt call to say the last 2 bp meds that Dr Regis Bill had put him on since the recall of VALSARTAN 160 mg did not work or was to expensive. The pharmacy told him the recall was lifted so they refill his Valsartan an that is what he has been taking the last couple of months. Pt said the valsartan 160mg  is what works best for him and would like to continue to take it.    [place]

## 2017-08-23 NOTE — Telephone Encounter (Signed)
FYI

## 2017-08-24 MED ORDER — VALSARTAN 160 MG PO TABS: 160.0000 mg | ORAL_TABLET | Freq: Every day | ORAL | 1 refills | Status: DC

## 2017-08-24 NOTE — Telephone Encounter (Signed)
[facility] per [facility] request. Pt aware of rx sent and rec's. Nothing further needed.

## 2017-08-24 NOTE — Telephone Encounter (Signed)
I am fine with this but have him send in readings  After back on   For a month  To ensure control readings

## 2017-10-17 ENCOUNTER — Other Ambulatory Visit: Payer: Self-pay | Admitting: Internal Medicine

## 2017-10-17 NOTE — Telephone Encounter (Signed)
Need BP readings prior to refilling to check for control.   Previous refill was okayed but patient was asked to send in BP readings x 1 month so that we could check control on this medication.   Burnis Medin, MD  08/24/17  10:01 AM  Note  I am fine with this but have him send in readings  After back on   For a month  To ensure control readings     There are no notes of BP readings being dropped off.   Called and LM for patient to call back to discuss.

## 2017-10-17 NOTE — Telephone Encounter (Signed)
I spoke with pt and average blood pressue readings are 120-130 over 70-80 range. Request refill on Valsartan [facility]

## 2017-10-21 ENCOUNTER — Other Ambulatory Visit: Payer: Self-pay | Admitting: Internal Medicine

## 2017-11-17 ENCOUNTER — Other Ambulatory Visit: Payer: Self-pay | Admitting: Internal Medicine

## 2017-11-18 ENCOUNTER — Other Ambulatory Visit: Payer: Self-pay | Admitting: Internal Medicine

## 2018-01-05 ENCOUNTER — Other Ambulatory Visit: Payer: Self-pay | Admitting: Internal Medicine

## 2018-01-09 ENCOUNTER — Other Ambulatory Visit: Payer: Self-pay | Admitting: Internal Medicine

## 2018-02-07 ENCOUNTER — Other Ambulatory Visit: Payer: Self-pay | Admitting: Internal Medicine

## 2018-02-08 NOTE — Progress Notes (Signed)
Chief Complaint  Patient presents with  . Follow-up    BP Check.  Valsartan refills, needs to change d/t medication being on back order.    HPI: BOMANI OOMMEN 62 y.o. come in for Chronic disease management   bp about 130/70  Doing ok  Cost of valsartan 23 and only 10 losartan and ivisartan 13    [facility]    No se   No recnet a fib like sx and cant remember   When ? If only once this year  Taking asa   ROS: See pertinent positives and negatives per HPI. askas about Nitrous oxide to be used as needed for  Poss ed .  No exdrecise intolerance  Has a physical jog  40 hours week drive and lifts   Past Medical History:  Diagnosis Date  . Allergic rhinitis   . Asthma    ?  Marland Kitchen Atrial fibrillation, persistent (HCC)    Paroxysmal  . 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 Hypothyroidism    s/p hyperthyroid heart failure and RAI rx  . Low HDL (under 40)   . Umbilical hernia    surgery consult no intervention if no sx     Family History  Problem Relation Age of Onset  . Stroke Mother   . Stomach cancer Father   . Diabetes Unknown   . Asthma Unknown        sibs  . Alcohol abuse Unknown        sib    Social History   Socioeconomic History  . Marital status: Married    Spouse name: Not on file  . Number of children: Not on file  . Years of education: Not on file  . Highest education level: Not on file  Occupational History  . 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 Financial resource strain: Not on file  . Food insecurity:    Worry: Not on file    Inability: Not on file  . Transportation needs:    Medical: Not on file    Non-medical: Not on file  Tobacco Use  . Smoking status: Never Smoker  . Smokeless tobacco: Never Used  Substance and Sexual Activity  . Alcohol use: No  . Drug use: No  . Sexual activity: Not on file  Lifestyle  . Physical activity:    Days per week: Not on  file    Minutes per session: Not on file  . Stress: Not on file  Relationships  . Social connections:    Talks on phone: Not on file    Gets together: Not on file    Attends religious service: Not on file    Active member of club or organization: Not on file    Attends meetings of clubs or organizations: Not on file    Relationship status: Not on file  Other Topics Concern  . Not on file  Social History Narrative   Married, 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 Long Hours. 50 hours per week.   Does physical loading.  Ali Chuk     Outpatient Medications Prior to Visit  Medication Sig Dispense Refill  . albuterol (VENTOLIN HFA) 108 (90 Base) MCG/ACT inhaler INHALE 1 TO 2 PUFFS BY MOUTH EVERY 6  HOURS AS NEEDED FOR ASTHMA 1 Inhaler 3  . aspirin 81 MG tablet Take 81 mg by mouth daily.    . fluticasone (FLONASE) 50 MCG/ACT nasal spray Place 2 sprays into the nose daily. Prn    . fluticasone furoate-vilanterol (BREO ELLIPTA) 100-25 MCG/INH AEPB Inhale 1 puff into the lungs daily. Controller inhaler. 1 each 11  . levothyroxine (SYNTHROID, LEVOTHROID) 112 MCG tablet TAKE 1 TABLET BY MOUTH EVERY DAY 90 tablet 1  . triamterene-hydrochlorothiazide (MAXZIDE-25) 37.5-25 MG tablet Take 0.5 tablets by mouth daily. 45 tablet 3  . verapamil (CALAN-SR) 120 MG CR tablet TAKE 0.5 TABLETS (60 MG TOTAL) BY MOUTH DAILY. 45 tablet 1  . verapamil (CALAN-SR) 240 MG CR tablet Take 1 tablet (240 mg total) by mouth at bedtime. 90 tablet 3  . candesartan (ATACAND) 16 MG tablet Take 1 tablet (16 mg total) by mouth daily. 30 tablet 2  . valsartan (DIOVAN) 160 MG tablet TAKE 1 TABLET (160 MG TOTAL) BY MOUTH DAILY. NEEDS OFFICE VISIT FOR BP CHECK FOR FURTHER REFILLS 30 tablet 0   No facility-administered medications prior to visit.      EXAM:  BP 130/72 (BP Location: Right Arm, Patient Position: Sitting, Cuff Size:  Normal)   Pulse (!) 48   Temp 98.2 F (36.8 C) (Oral)   Wt 199 lb 1.6 oz (90.3 kg)   BMI 26.27 kg/m   Body mass index is 26.27 kg/m.  GENERAL: vitals reviewed and listed above, alert, oriented, appears well hydrated and in no acute distress HEENT: atraumatic, conjunctiva  clear, no obvious abnormalities on inspection of external nose and ears NECK: no obvious masses on inspection palpation  LUNGS: clear to auscultation bilaterally, no wheezes, rales or rhonchi, good air movement CV: HRRR, no clubbing cyanosis or  peripheral edema nl cap refill  MS: moves all extremities without noticeable focal  abnormality PSYCH: pleasant and cooperative, no obvious depression or anxiety  BP Readings from Last 3 Encounters:  02/11/18 130/72  04/09/17 (!) 150/100  03/27/16 140/90   Wt Readings from Last 3 Encounters:  02/11/18 199 lb 1.6 oz (90.3 kg)  04/09/17 203 lb 3.2 oz (92.2 kg)  03/27/16 199 lb (90.3 kg)    ASSESSMENT AND PLAN:  Discussed the following assessment and plan:  Essential hypertension  Medication management  Paroxysmal atrial fibrillation (HCC)  hx of  - apparently quiesent  on asa at this time   Due for  Fu labs and cpx   After change   Disc  Supplements  cautions  At this time  Advise exercise and fu lab monitoring at his cpx and go from there not interested in  Ed meds at this time. Sty on asa  -Patient advised to return or notify health care team  if  new concerns arise.  Patient Instructions  Ok to change medication   And will assess how working at c[facility]  In June  Or therabouts .   If not controlling then  Losartan 50 mg per day  There are other options   Make  [facility] appt  For June      Wanda K. Panosh M.D.

## 2018-02-11 ENCOUNTER — Encounter: Payer: Self-pay | Admitting: Internal Medicine

## 2018-02-11 ENCOUNTER — Ambulatory Visit: Payer: 59 | Admitting: Internal Medicine

## 2018-02-11 VITALS — BP 130/72 | HR 48 | Temp 98.2°F | Wt 199.1 lb

## 2018-02-11 DIAGNOSIS — Z79899 Other long term (current) drug therapy: Secondary | ICD-10-CM | POA: Diagnosis not present

## 2018-02-11 DIAGNOSIS — I10 Essential (primary) hypertension: Secondary | ICD-10-CM

## 2018-02-11 DIAGNOSIS — I480 Paroxysmal atrial fibrillation: Secondary | ICD-10-CM | POA: Diagnosis not present

## 2018-02-11 MED ORDER — LOSARTAN POTASSIUM 50 MG PO TABS: 50.0000 mg | ORAL_TABLET | Freq: Every day | ORAL | 1 refills | Status: DC

## 2018-02-11 NOTE — Telephone Encounter (Signed)
Med changed today in office visit.  Nothing further needed.

## 2018-02-11 NOTE — Patient Instructions (Addendum)
Ok to change medication   And will assess how working at [facility]  In June  Or therabouts .   If not controlling then  Losartan 50 mg per day  There are other options   Make  [facility] appt  For June

## 2018-04-07 ENCOUNTER — Other Ambulatory Visit: Payer: Self-pay | Admitting: Internal Medicine

## 2018-04-19 ENCOUNTER — Other Ambulatory Visit: Payer: Self-pay | Admitting: Internal Medicine

## 2018-04-24 ENCOUNTER — Other Ambulatory Visit: Payer: Self-pay | Admitting: Internal Medicine

## 2018-04-24 NOTE — Telephone Encounter (Signed)
Pt states pharmacy advised him of denial on RX. Pt saw Dr. Regis Bill in April 2019. Pt takes total of 300mg  verapamil per day. He takes 1 240mg  and 1/2 120mg  tablet at a time to equal 300mg . He ran out of the 240mg  tablets this morning. Please advise.  [id number] [address], [place] [phone] (Phone) [phone] (Fax)

## 2018-04-26 MED ORDER — VERAPAMIL HCL ER 240 MG PO TBCR: 240.0000 mg | EXTENDED_RELEASE_TABLET | Freq: Every day | ORAL | 0 refills | Status: DC

## 2018-04-26 NOTE — Addendum Note (Signed)
Addended by: Aggie Hacker A on: 04/26/2018 04:44 PM   Modules accepted: Orders

## 2018-04-26 NOTE — Telephone Encounter (Signed)
[facility] 

## 2018-04-30 ENCOUNTER — Encounter: Payer: 59 | Admitting: Internal Medicine

## 2018-05-11 ENCOUNTER — Other Ambulatory Visit: Payer: Self-pay | Admitting: Internal Medicine

## 2018-05-13 NOTE — Telephone Encounter (Signed)
I spoke with pt and he does have a physical scheduled for next week with Dr. Regis Bill, he has enough medicaiton to last until then.

## 2018-05-17 NOTE — Progress Notes (Signed)
Chief Complaint  Patient presents with  . Annual Exam    HPI: Patient  James Nunez  62 y.o. comes in today for Preventive Health Care visit  And Chronic disease management  Bp runs about 130/70  85 range   Taking med  As  Directed   No syncope cp sob   Thyroid  Med qd   Hx of paf    No sx of such  For along time  No syncope   On breo controlling asthma  Rare use of rescue.     Health Maintenance  Topic Date Due  . Hepatitis C Screening  [birth year]  . HIV Screening  06/[birth year]/1969  . INFLUENZA VACCINE  06/06/2018  . COLONOSCOPY  11/06/2018  . TETANUS/TDAP  11/06/2018   Health Maintenance Review LIFESTYLE:  Exercise:  Work heavy glass delivey trucks etc  Tobacco/ETS:n Alcohol: n Sugar beverages:n Sleep:6-7 Drug use: no HH of 2 no pwets Work:50 hours  ROS:  GEN/ HEENT: No fever, significant weight changes sweats headaches vision problems hearing changes, CV/ PULM; No chest pain shortness of breath cough, syncope,edema  change in exercise tolerance. GI /GU: No adominal pain, vomiting, change in bowel habits. No blood in the stool. No significant GU symptoms. SKIN/HEME: ,no acute skin rashes suspicious lesions or bleeding. No lymphadenopathy, nodules, masses.  NEURO/ PSYCH:  No neurologic signs such as weakness numbness. No depression anxiety. IMM/ Allergy: No unusual infections.  Allergy .   REST of 12 system review negative except as per HPI   Past Medical History:  Diagnosis Date  . Allergic rhinitis   . Asthma    ?  Marland Kitchen Atrial fibrillation, persistent (HCC)    Paroxysmal  . 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 Hypothyroidism    s/p hyperthyroid heart failure and RAI rx  . Low HDL (under 40)   . Umbilical hernia    surgery consult no intervention if no sx     Past Surgical History:  Procedure Laterality Date  . no surgical history      Family History  Problem Relation Age of Onset  . Stroke Mother   . Stomach cancer Father     . Diabetes Unknown   . Asthma Unknown        sibs  . Alcohol abuse Unknown        sib    Social History   Socioeconomic History  . Marital status: Married    Spouse name: Not on file  . Number of children: Not on file  . Years of education: Not on file  . Highest education level: Not on file  Occupational History  . 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 Financial resource strain: Not on file  . Food insecurity:    Worry: Not on file    Inability: Not on file  . Transportation needs:    Medical: Not on file    Non-medical: Not on file  Tobacco Use  . Smoking status: Never Smoker  . Smokeless tobacco: Never Used  Substance and Sexual Activity  . Alcohol use: No  . Drug use: No  . Sexual activity: Not on file  Lifestyle  . Physical activity:    Days per week: Not on file    Minutes per session: Not on file  . Stress: Not on file  Relationships  . Social connections:    Talks on  phone: Not on file    Gets together: Not on file    Attends religious service: Not on file    Active member of club or organization: Not on file    Attends meetings of clubs or organizations: Not on file    Relationship status: Not on file  Other Topics Concern  . Not on file  Social History Narrative   Married, 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 Long Hours. 50 hours per week.   Does physical loading.  [place]     Outpatient Medications Prior to Visit  Medication Sig Dispense Refill  . albuterol (VENTOLIN HFA) 108 (90 Base) MCG/ACT inhaler INHALE 1 TO 2 PUFFS BY MOUTH EVERY 6 HOURS AS NEEDED FOR ASTHMA 1 Inhaler 3  . aspirin 81 MG tablet Take 81 mg by mouth daily.    Marland Kitchen BREO ELLIPTA 100-25 MCG/INH AEPB INHALE 1 PUFF INTO THE LUNGS DAILY. 60 each 3  . fluticasone (FLONASE) 50 MCG/ACT nasal spray Place 2 sprays into the nose daily. Prn     . levothyroxine (SYNTHROID, LEVOTHROID) 112 MCG tablet TAKE 1 TABLET BY MOUTH EVERY DAY 90 tablet 1  . losartan (COZAAR) 50 MG tablet Take 1 tablet (50 mg total) by mouth daily. 90 tablet 1  . triamterene-hydrochlorothiazide (MAXZIDE-25) 37.5-25 MG tablet Take 0.5 tablets by mouth daily. 45 tablet 3  . verapamil (CALAN-SR) 120 MG CR tablet TAKE 0.5 TABLETS (60 MG TOTAL) BY MOUTH DAILY. 45 tablet 1  . verapamil (CALAN-SR) 240 MG CR tablet Take 1 tablet (240 mg total) by mouth at bedtime. 90 tablet 0   No facility-administered medications prior to visit.      EXAM:  BP 140/88   Pulse 60   Ht 6\' 1"  (1.854 m)   Wt 195 lb (88.5 kg)   BMI 25.73 kg/m   Body mass index is 25.73 kg/m. Wt Readings from Last 3 Encounters:  05/20/18 195 lb (88.5 kg)  02/11/18 199 lb 1.6 oz (90.3 kg)  04/09/17 203 lb 3.2 oz (92.2 kg)    Physical Exam: Vital signs reviewed ZOX:WRUE is a well-developed well-nourished alert cooperative    who appearsr stated age in no acute distress.  HEENT: normocephalic atraumatic , Eyes: PERRL EOM's full, conjunctiva clear, Nares: paten,t no deformity discharge or tenderness., Ears: no deformity EAC's clear TMs with normal landmarks. Mouth: clear OP, no lesions, edema.  Moist mucous membranes. Dentition in adequate repair. NECK: supple without masses, thyromegaly or bruits. CHEST/PULM:  Clear to auscultation and percussion breath sounds equal no wheeze , rales or rhonchi. No chest wall deformities or tenderness. Breast: normal by inspection . No dimpling, discharge, masses, tenderness or discharge . CV: PMI is nondisplaced, S1 S2 no gallops, murmurs, rubs. Peripheral pulses are full without delay.No JVD .  ABDOMEN: Bowel sounds normal nontender  No guard or rebound, no hepato splenomegal no CVA tenderness.  No hernia. Extremtities:  No clubbing cyanosis or edema, no acute joint swelling or redness no focal atrophy NEURO:  Oriented x3, cranial nerves 3-12 appear to be  intact, no obvious focal weakness,gait within normal limits no abnormal reflexes or asymmetrical SKIN: No acute rashes normal turgor, color, no bruising or petechiae. PSYCH: Oriented, good eye contact, no obvious depression anxiety, cognition and judgment appear normal. LN: no cervical axillary inguinal adenopathy  Lab Results  Component Value Date   WBC 5.5  04/09/2017   HGB 14.8 04/09/2017   HCT 44.7 04/09/2017   PLT 177.0 04/09/2017   GLUCOSE 98 04/09/2017   CHOL 150 04/09/2017   TRIG 94.0 04/09/2017   HDL 30.30 (L) 04/09/2017   LDLCALC 101 (H) 04/09/2017   ALT 12 04/09/2017   AST 16 04/09/2017   NA 140 04/09/2017   K 3.8 04/09/2017   CL 104 04/09/2017   CREATININE 0.91 04/09/2017   BUN 9 04/09/2017   CO2 30 04/09/2017   TSH 0.21 (L) 04/09/2017   PSA 0.57 04/09/2017   HGBA1C 6.2 04/09/2017    BP Readings from Last 3 Encounters:  05/20/18 140/88  02/11/18 130/72  04/09/17 (!) 150/100   Ate a banana this am   ASSESSMENT AND PLAN:  Discussed the following assessment and plan:  Visit for preventive health examination - Plan: Basic metabolic panel, CBC with Differential/Platelet, Hepatic function panel, Lipid panel, TSH, PSA, Hemoglobin A1c  Medication management - Plan: Basic metabolic panel, CBC with Differential/Platelet, Hepatic function panel, Lipid panel, TSH, PSA, Hemoglobin A1c  Hypothyroidism, unspecified type - Plan: Basic metabolic panel, CBC with Differential/Platelet, Hepatic function panel, Lipid panel, TSH, PSA, Hemoglobin A1c  PAF (paroxysmal atrial fibrillation) (HCC) - Plan: Basic metabolic panel, CBC with Differential/Platelet, Hepatic function panel, Lipid panel, TSH, PSA, Hemoglobin A1c  Essential hypertension - Plan: Basic metabolic panel, CBC with Differential/Platelet, Hepatic function panel, Lipid panel, TSH, PSA, Hemoglobin A1c  Fasting hyperglycemia - Plan: Basic metabolic panel, CBC with Differential/Platelet, Hepatic function panel, Lipid  panel, TSH, PSA, Hemoglobin I6E  Uncomplicated asthma, unspecified asthma severity, unspecified whether persistent - Plan: Basic metabolic panel, CBC with Differential/Platelet, Hepatic function panel, Lipid panel, TSH, PSA, Hemoglobin A1c  Encounter for hepatitis C screening test for low risk patient - Plan: Hepatitis C antibody If pulse 50 and below and or sx  Contact team about med adjustment but doing quite well at this time without   Sx .  Patient Care Team: Burnis Medin, MD as PCP - General Leonie Man, MD as Consulting Physician (Cardiology) Patient Instructions  Glad you are doing well,  Goal bp is 120/80 but  133/80 is acceptable .  If dizziness and pulse is low 50 and below then contact care team   Medication lowers pulse as well as blood pressures  Will notify you  of labs when available. If all ok then yearly cpx and labs      Health Maintenance, Male A healthy lifestyle and preventive care is important for your health and wellness. Ask your health care provider about what schedule of regular examinations is right for you. What should I know about weight and diet? Eat a Healthy Diet  Eat plenty of vegetables, fruits, whole grains, low-fat dairy products, and lean protein.  Do not eat a lot of foods high in solid fats, added sugars, or salt.  Maintain a Healthy Weight Regular exercise can help you achieve or maintain a healthy weight. You should:  Do at least 150 minutes of exercise each week. The exercise should increase your heart rate and make you sweat (moderate-intensity exercise).  Do strength-training exercises at least twice a week.  Watch Your Levels of Cholesterol and Blood Lipids  Have your blood tested for lipids and cholesterol every 5 years starting at 64 years of age. If you are at high risk for heart disease, you should start having your blood tested when you are 64 years old. You may need to have your cholesterol levels checked more  often  if: ? Your lipid or cholesterol levels are high. ? You are older than 64 years of age. ? You are at high risk for heart disease.  What should I know about cancer screening? Many types of cancers can be detected early and may often be prevented. Lung Cancer  You should be screened every year for lung cancer if: ? You are a current smoker who has smoked for at least 30 years. ? You are a former smoker who has quit within the past 15 years.  Talk to your health care provider about your screening options, when you should start screening, and how often you should be screened.  Colorectal Cancer  Routine colorectal cancer screening usually begins at 64 years of age and should be repeated every 5-10 years until you are 64 years old. You may need to be screened more often if early forms of precancerous polyps or small growths are found. Your health care provider may recommend screening at an earlier age if you have risk factors for colon cancer.  Your health care provider may recommend using home test kits to check for hidden blood in the stool.  A small camera at the end of a tube can be used to examine your colon (sigmoidoscopy or colonoscopy). This checks for the earliest forms of colorectal cancer.  Prostate and Testicular Cancer  Depending on your age and overall health, your health care provider may do certain tests to screen for prostate and testicular cancer.  Talk to your health care provider about any symptoms or concerns you have about testicular or prostate cancer.  Skin Cancer  Check your skin from head to toe regularly.  Tell your health care provider about any new moles or changes in moles, especially if: ? There is a change in a mole's size, shape, or color. ? You have a mole that is larger than a pencil eraser.  Always use sunscreen. Apply sunscreen liberally and repeat throughout the day.  Protect yourself by wearing long sleeves, pants, a wide-brimmed hat, and  sunglasses when outside.  What should I know about heart disease, diabetes, and high blood pressure?  If you are 46-61 years of age, have your blood pressure checked every 3-5 years. If you are 19 years of age or older, have your blood pressure checked every year. You should have your blood pressure measured twice-once when you are at a hospital or clinic, and once when you are not at a hospital or clinic. Record the average of the two measurements. To check your blood pressure when you are not at a hospital or clinic, you can use: ? An automated blood pressure machine at a pharmacy. ? A home blood pressure monitor.  Talk to your health care provider about your target blood pressure.  If you are between 40-25 years old, ask your health care provider if you should take aspirin to prevent heart disease.  Have regular diabetes screenings by checking your fasting blood sugar level. ? If you are at a normal weight and have a low risk for diabetes, have this test once every three years after the age of 62. ? If you are overweight and have a high risk for diabetes, consider being tested at a younger age or more often.  A one-time screening for abdominal aortic aneurysm (AAA) by ultrasound is recommended for men aged 71-75 years who are current or former smokers. What should I know about preventing infection? Hepatitis B If you have a higher risk  for hepatitis B, you should be screened for this virus. Talk with your health care provider to find out if you are at risk for hepatitis B infection. Hepatitis C Blood testing is recommended for:  Everyone born from [birth year] through 1967.  Anyone with known risk factors for hepatitis C.  Sexually Transmitted Diseases (STDs)  You should be screened each year for STDs including gonorrhea and chlamydia if: ? You are sexually active and are younger than 64 years of age. ? You are older than 64 years of age and your health care provider tells you that you are  at risk for this type of infection. ? Your sexual activity has changed since you were last screened and you are at an increased risk for chlamydia or gonorrhea. Ask your health care provider if you are at risk.  Talk with your health care provider about whether you are at high risk of being infected with HIV. Your health care provider may recommend a prescription medicine to help prevent HIV infection.  What else can I do?  Schedule regular health, dental, and eye exams.  Stay current with your vaccines (immunizations).  Do not use any tobacco products, such as cigarettes, chewing tobacco, and e-cigarettes. If you need help quitting, ask your health care provider.  Limit alcohol intake to no more than 2 drinks per day. One drink equals 12 ounces of beer, 5 ounces of wine, or 1 ounces of hard liquor.  Do not use street drugs.  Do not share needles.  Ask your health care provider for help if you need support or information about quitting drugs.  Tell your health care provider if you often feel depressed.  Tell your health care provider if you have ever been abused or do not feel safe at home. This information is not intended to replace advice given to you by your health care provider. Make sure you discuss any questions you have with your health care provider. Document Released: 04/20/2008 Document Revised: 06/21/2016 Document Reviewed: 07/27/2015 Elsevier Interactive Patient Education  2018 [place]. Jahni Paul M.D.

## 2018-05-20 ENCOUNTER — Encounter: Payer: Self-pay | Admitting: Internal Medicine

## 2018-05-20 ENCOUNTER — Ambulatory Visit (INDEPENDENT_AMBULATORY_CARE_PROVIDER_SITE_OTHER): Payer: 59 | Admitting: Internal Medicine

## 2018-05-20 VITALS — BP 140/88 | HR 60 | Ht 73.0 in | Wt 195.0 lb

## 2018-05-20 DIAGNOSIS — Z79899 Other long term (current) drug therapy: Secondary | ICD-10-CM | POA: Diagnosis not present

## 2018-05-20 DIAGNOSIS — Z0000 Encounter for general adult medical examination without abnormal findings: Secondary | ICD-10-CM

## 2018-05-20 DIAGNOSIS — J45909 Unspecified asthma, uncomplicated: Secondary | ICD-10-CM

## 2018-05-20 DIAGNOSIS — I10 Essential (primary) hypertension: Secondary | ICD-10-CM | POA: Diagnosis not present

## 2018-05-20 DIAGNOSIS — Z1159 Encounter for screening for other viral diseases: Secondary | ICD-10-CM | POA: Diagnosis not present

## 2018-05-20 DIAGNOSIS — E039 Hypothyroidism, unspecified: Secondary | ICD-10-CM

## 2018-05-20 DIAGNOSIS — I480 Paroxysmal atrial fibrillation: Secondary | ICD-10-CM | POA: Diagnosis not present

## 2018-05-20 DIAGNOSIS — R7301 Impaired fasting glucose: Secondary | ICD-10-CM

## 2018-05-20 NOTE — Patient Instructions (Signed)
Glad you are doing well,  Goal bp is 120/80 but  133/80 is acceptable .  If dizziness and pulse is low 50 and below then contact care team   Medication lowers pulse as well as blood pressures  Will notify you  of labs when available. If all ok then yearly cpx and labs      Health Maintenance, Male A healthy lifestyle and preventive care is important for your health and wellness. Ask your health care provider about what schedule of regular examinations is right for you. What should I know about weight and diet? Eat a Healthy Diet  Eat plenty of vegetables, fruits, whole grains, low-fat dairy products, and lean protein.  Do not eat a lot of foods high in solid fats, added sugars, or salt.  Maintain a Healthy Weight Regular exercise can help you achieve or maintain a healthy weight. You should:  Do at least 150 minutes of exercise each week. The exercise should increase your heart rate and make you sweat (moderate-intensity exercise).  Do strength-training exercises at least twice a week.  Watch Your Levels of Cholesterol and Blood Lipids  Have your blood tested for lipids and cholesterol every 5 years starting at 64 years of age. If you are at high risk for heart disease, you should start having your blood tested when you are 64 years old. You may need to have your cholesterol levels checked more often if: ? Your lipid or cholesterol levels are high. ? You are older than 64 years of age. ? You are at high risk for heart disease.  What should I know about cancer screening? Many types of cancers can be detected early and may often be prevented. Lung Cancer  You should be screened every year for lung cancer if: ? You are a current smoker who has smoked for at least 30 years. ? You are a former smoker who has quit within the past 15 years.  Talk to your health care provider about your screening options, when you should start screening, and how often you should be  screened.  Colorectal Cancer  Routine colorectal cancer screening usually begins at 64 years of age and should be repeated every 5-10 years until you are 64 years old. You may need to be screened more often if early forms of precancerous polyps or small growths are found. Your health care provider may recommend screening at an earlier age if you have risk factors for colon cancer.  Your health care provider may recommend using home test kits to check for hidden blood in the stool.  A small camera at the end of a tube can be used to examine your colon (sigmoidoscopy or colonoscopy). This checks for the earliest forms of colorectal cancer.  Prostate and Testicular Cancer  Depending on your age and overall health, your health care provider may do certain tests to screen for prostate and testicular cancer.  Talk to your health care provider about any symptoms or concerns you have about testicular or prostate cancer.  Skin Cancer  Check your skin from head to toe regularly.  Tell your health care provider about any new moles or changes in moles, especially if: ? There is a change in a mole's size, shape, or color. ? You have a mole that is larger than a pencil eraser.  Always use sunscreen. Apply sunscreen liberally and repeat throughout the day.  Protect yourself by wearing long sleeves, pants, a wide-brimmed hat, and sunglasses when outside.  What  should I know about heart disease, diabetes, and high blood pressure?  If you are 68-48 years of age, have your blood pressure checked every 3-5 years. If you are 72 years of age or older, have your blood pressure checked every year. You should have your blood pressure measured twice-once when you are at a hospital or clinic, and once when you are not at a hospital or clinic. Record the average of the two measurements. To check your blood pressure when you are not at a hospital or clinic, you can use: ? An automated blood pressure machine at a  pharmacy. ? A home blood pressure monitor.  Talk to your health care provider about your target blood pressure.  If you are between 23-21 years old, ask your health care provider if you should take aspirin to prevent heart disease.  Have regular diabetes screenings by checking your fasting blood sugar level. ? If you are at a normal weight and have a low risk for diabetes, have this test once every three years after the age of 39. ? If you are overweight and have a high risk for diabetes, consider being tested at a younger age or more often.  A one-time screening for abdominal aortic aneurysm (AAA) by ultrasound is recommended for men aged 40-75 years who are current or former smokers. What should I know about preventing infection? Hepatitis B If you have a higher risk for hepatitis B, you should be screened for this virus. Talk with your health care provider to find out if you are at risk for hepatitis B infection. Hepatitis C Blood testing is recommended for:  Everyone born from [birth year] through 1967.  Anyone with known risk factors for hepatitis C.  Sexually Transmitted Diseases (STDs)  You should be screened each year for STDs including gonorrhea and chlamydia if: ? You are sexually active and are younger than 64 years of age. ? You are older than 64 years of age and your health care provider tells you that you are at risk for this type of infection. ? Your sexual activity has changed since you were last screened and you are at an increased risk for chlamydia or gonorrhea. Ask your health care provider if you are at risk.  Talk with your health care provider about whether you are at high risk of being infected with HIV. Your health care provider may recommend a prescription medicine to help prevent HIV infection.  What else can I do?  Schedule regular health, dental, and eye exams.  Stay current with your vaccines (immunizations).  Do not use any tobacco products, such as  cigarettes, chewing tobacco, and e-cigarettes. If you need help quitting, ask your health care provider.  Limit alcohol intake to no more than 2 drinks per day. One drink equals 12 ounces of beer, 5 ounces of wine, or 1 ounces of hard liquor.  Do not use street drugs.  Do not share needles.  Ask your health care provider for help if you need support or information about quitting drugs.  Tell your health care provider if you often feel depressed.  Tell your health care provider if you have ever been abused or do not feel safe at home. This information is not intended to replace advice given to you by your health care provider. Make sure you discuss any questions you have with your health care provider. Document Released: 04/20/2008 Document Revised: 06/21/2016 Document Reviewed: 07/27/2015 Elsevier Interactive Patient Education  [employer].

## 2018-05-21 LAB — CBC WITH DIFFERENTIAL/PLATELET
BASOS ABS: 0.1 10*3/uL (ref 0.0–0.1)
Basophils Relative: 1.2 % (ref 0.0–3.0)
Eosinophils Absolute: 0.1 10*3/uL (ref 0.0–0.7)
Eosinophils Relative: 1.9 % (ref 0.0–5.0)
HCT: 46.4 % (ref 39.0–52.0)
Hemoglobin: 15.2 g/dL (ref 13.0–17.0)
LYMPHS ABS: 1.8 10*3/uL (ref 0.7–4.0)
Lymphocytes Relative: 24 % (ref 12.0–46.0)
MCHC: 32.8 g/dL (ref 30.0–36.0)
MCV: 86.8 fl (ref 78.0–100.0)
MONO ABS: 0.5 10*3/uL (ref 0.1–1.0)
MONOS PCT: 7.4 % (ref 3.0–12.0)
NEUTROS PCT: 65.5 % (ref 43.0–77.0)
Neutro Abs: 4.8 10*3/uL (ref 1.4–7.7)
Platelets: 182 10*3/uL (ref 150.0–400.0)
RBC: 5.35 Mil/uL (ref 4.22–5.81)
RDW: 14 % (ref 11.5–15.5)
WBC: 7.3 10*3/uL (ref 4.0–10.5)

## 2018-05-21 LAB — LIPID PANEL
CHOL/HDL RATIO: 5
Cholesterol: 179 mg/dL (ref 0–200)
HDL: 37.4 mg/dL — ABNORMAL LOW (ref >39.00)
LDL CALC: 126 mg/dL — AB (ref 0–99)
NONHDL: 141.26
Triglycerides: 78 mg/dL (ref 0.0–149.0)
VLDL: 15.6 mg/dL (ref 0.0–40.0)

## 2018-05-21 LAB — HEMOGLOBIN A1C: Hgb A1c MFr Bld: 6 % (ref 4.6–6.5)

## 2018-05-21 LAB — HEPATITIS C ANTIBODY
Hepatitis C Ab: NONREACTIVE
SIGNAL TO CUT-OFF: 0.24 (ref <1.00)

## 2018-05-21 LAB — BASIC METABOLIC PANEL
BUN: 8 mg/dL (ref 6–23)
CALCIUM: 9 mg/dL (ref 8.4–10.5)
CO2: 30 mEq/L (ref 19–32)
Chloride: 103 mEq/L (ref 96–112)
Creatinine, Ser: 0.95 mg/dL (ref 0.40–1.50)
GFR: 102.66 mL/min (ref >60.00)
GLUCOSE: 89 mg/dL (ref 70–99)
Potassium: 3.5 mEq/L (ref 3.5–5.1)
SODIUM: 140 meq/L (ref 135–145)

## 2018-05-21 LAB — HEPATIC FUNCTION PANEL
ALK PHOS: 91 U/L (ref 39–117)
ALT: 15 U/L (ref 0–53)
AST: 18 U/L (ref 0–37)
Albumin: 4.1 g/dL (ref 3.5–5.2)
BILIRUBIN TOTAL: 0.5 mg/dL (ref 0.2–1.2)
Bilirubin, Direct: 0.1 mg/dL (ref 0.0–0.3)
Total Protein: 8.3 g/dL (ref 6.0–8.3)

## 2018-05-21 LAB — PSA: PSA: 0.7 ng/mL (ref 0.10–4.00)

## 2018-05-21 LAB — TSH: TSH: 0.22 u[IU]/mL — AB (ref 0.35–4.50)

## 2018-05-22 ENCOUNTER — Other Ambulatory Visit: Payer: Self-pay | Admitting: Internal Medicine

## 2018-05-22 NOTE — Telephone Encounter (Signed)
Looks like pt recently had thyroid level checked, will there be any changes to the dose before we send in new refill?

## 2018-05-24 ENCOUNTER — Ambulatory Visit: Payer: Self-pay | Admitting: *Deleted

## 2018-05-24 ENCOUNTER — Other Ambulatory Visit: Payer: Self-pay | Admitting: Family Medicine

## 2018-05-24 DIAGNOSIS — E039 Hypothyroidism, unspecified: Secondary | ICD-10-CM

## 2018-05-24 MED ORDER — LEVOTHYROXINE SODIUM 100 MCG PO TABS: 100.0000 ug | ORAL_TABLET | Freq: Every day | ORAL | 0 refills | Status: DC

## 2018-05-24 NOTE — Addendum Note (Signed)
Addended by: Aggie Hacker A on: 05/24/2018 04:48 PM   Modules accepted: Orders

## 2018-05-24 NOTE — Telephone Encounter (Signed)
I spoke with pt and went over advice, made change in record to reflect dose change.

## 2018-05-24 NOTE — Telephone Encounter (Signed)
Please see result note  We need to dec dose of  Thyroid to 100 mcg per day  Disp 90 refill x 1  and then recheck tsh in 3 months ( no ov)   resport rest of reulst note( 2 )  To paitent

## 2018-05-24 NOTE — Telephone Encounter (Signed)
Decrease the  Verapamil to 240 per day ( ie don't take the 60 of  The 120 )   Also see the  Result note about the thyroid   After changing the medication    Send in pulse and bp readings in a month or OV  To decide on next step .

## 2018-05-24 NOTE — Telephone Encounter (Signed)
Will send to Dr Regis Bill as Juluis Rainier and recommendations

## 2018-05-24 NOTE — Telephone Encounter (Signed)
Pt reports seen by Dr. Regis Bill 05/20/18 for annual exam. Noted at that time HR 60. Per pt's report he was instructed to notify Dr. Regis Bill if bradycardia continues. Pt states HR has ranged from 45-52 since appt.  This am 52; checked during call... 18.  Pt states Dr. Regis Bill mentioned possible medication adjustment. Pt denies any symptoms; no dizziness, CP, palpitations. "I feel great."  Please advise: [phone]  Reason for Disposition . Heart beating very slowly (e.g., < 50 / minute)  (Exception: athlete)  Answer Assessment - Initial Assessment Questions 1. DESCRIPTION: "Please describe your heart rate or heart beat that you are having" (e.g., fast/slow, regular/irregular, skipped or extra beats, "palpitations")    45-52 since seen in OV 7/15 2. ONSET: "When did it start?" (Minutes, hours or days)       3. DURATION: "How long does it last" (e.g., seconds, minutes, hours)      4. PATTERN "Does it come and go, or has it been constant since it started?"  "Does it get worse with exertion?"   "Are you feeling it now?"     5. TAP: "Using your hand, can you tap out what you are feeling on a chair or table in front of you, so that I can hear?" (Note: not all patients can do this)       6. HEART RATE: "Can you tell me your heart rate?" "How many beats in 15 seconds?"  (Note: not all patients can do this)       62 7. RECURRENT SYMPTOM: "Have you ever had this before?" If so, ask: "When was the last time?" and "What happened that time?"       8. CAUSE: "What do you think is causing the palpitations?"    None 9. CARDIAC HISTORY: "Do you have any history of heart disease?" (e.g., heart attack, angina, bypass surgery, angioplasty, arrhythmia)       10. OTHER SYMPTOMS: "Do you have any other symptoms?" (e.g., dizziness, chest pain, sweating, difficulty breathing)       none  Protocols used: HEART RATE AND HEARTBEAT QUESTIONS-A-AH

## 2018-07-16 ENCOUNTER — Telehealth: Payer: Self-pay | Admitting: Internal Medicine

## 2018-07-16 ENCOUNTER — Other Ambulatory Visit: Payer: Self-pay | Admitting: Internal Medicine

## 2018-07-16 NOTE — Telephone Encounter (Signed)
FYI, Dr Regis Bill.

## 2018-07-16 NOTE — Telephone Encounter (Signed)
Copied from [place] [id number]. Topic: Inquiry >> Jul 16, 2018  9:04 AM Scherrie Gerlach wrote: Reason for CRM: pt was to report to Dr Regis Bill over a month time his bp and pulse readings. Average 130/70     pulse rate over 31 days was <50. Highest of any day pulse rate 85 Highest Bp:  140/98  Lowest pulse 44 Lowest bp :108/66 Pt took his bp readings once a day, in the evening

## 2018-07-17 NOTE — Telephone Encounter (Addendum)
Although blood pressure seems controlled   Have concern about the low pulse  Below 50 and in 40s   I cant recall    Are you taking the 1/2 of 120 calan   Or just the 240  (Or only taking the 60 prn. )  If  You are still taking the  120 (1/2  )  Please stop this  And then continue to monitor BP and pulse .   If pul;se stays below 50 then we may need to rethink changes  Your medication .  (Ask also if he wants to  Begin a statin medication  For cholesterol  See last lab results)

## 2018-07-18 NOTE — Telephone Encounter (Signed)
Pt is only taking a total of Calan 240mg  daily  Pt has stopped the 120mg (1/2 tab)  Please advise Dr Regis Bill, thanks.

## 2018-07-26 NOTE — Telephone Encounter (Signed)
Decrease dose of   Calan to 180 cr   Per day( please send in  Disp 30 )refill x 2    may not control bp but   Working on pulse  Then plan ROV in   6-8 weeks to assess bp pulse or other med changes advised .  If getting  Racing heart then take an extra  60 mg as  You have been doing in past.

## 2018-07-29 MED ORDER — VERAPAMIL HCL ER 180 MG PO TBCR: 180.0000 mg | EXTENDED_RELEASE_TABLET | Freq: Every day | ORAL | 2 refills | Status: DC

## 2018-07-29 NOTE — Telephone Encounter (Signed)
Pt aware of change in medication Rx sent to [facility] Pt was not able to schedule OV at this time as he was driving - will call back to schedule follow up.  Nothing further needed.

## 2018-07-29 NOTE — Addendum Note (Signed)
Addended by: Virl Cagey on: 07/29/2018 03:31 PM   Modules accepted: Orders

## 2018-08-01 ENCOUNTER — Other Ambulatory Visit: Payer: Self-pay | Admitting: Internal Medicine

## 2018-08-19 ENCOUNTER — Other Ambulatory Visit: Payer: Self-pay | Admitting: Internal Medicine

## 2018-08-21 ENCOUNTER — Other Ambulatory Visit: Payer: Self-pay | Admitting: Internal Medicine

## 2018-08-22 ENCOUNTER — Other Ambulatory Visit: Payer: Self-pay | Admitting: Internal Medicine

## 2018-08-30 ENCOUNTER — Telehealth: Payer: Self-pay | Admitting: Internal Medicine

## 2018-08-30 NOTE — Telephone Encounter (Signed)
Copied from [place] [id number]. Topic: Quick Communication - See Telephone Encounter >> Aug 30, 2018  8:35 AM Conception Chancy, NT wrote: CRM for notification. See Telephone encounter for: 08/30/18.  Patient is calling and states his pharmacy can not refill losartan (COZAAR) 50 MG tablet that has potassium in it as it has been recalled. He states he has been off this mediation for 3 weeks and his blood pressure is 180/90. He would like to know what does he need to do.  [id number], [place] - [address] [place] [address] Phone: [phone] Fax: [phone]

## 2018-08-30 NOTE — Telephone Encounter (Signed)
Spoke with patient and asked if he could find out the alternatives to losartan and give [place] a call back.  Patient will call back.

## 2018-09-02 ENCOUNTER — Other Ambulatory Visit: Payer: Self-pay | Admitting: *Deleted

## 2018-09-02 MED ORDER — LOSARTAN POTASSIUM 25 MG PO TABS: ORAL_TABLET | ORAL | 1 refills | Status: DC

## 2018-09-09 ENCOUNTER — Ambulatory Visit: Payer: 59 | Admitting: Internal Medicine

## 2018-09-16 ENCOUNTER — Telehealth: Payer: Self-pay | Admitting: Internal Medicine

## 2018-09-16 NOTE — Telephone Encounter (Signed)
Copied from [place] [id number]. Topic: Quick Communication - See Telephone Encounter >> Sep 16, 2018  9:15 AM Percell Belt A wrote: CRM for notification. See Telephone encounter for: 09/16/18.  Pt called in and stated that provider change the dose of the thyroid and the verapamil (CALAN SR) 180 MG CR tablet [[id number]] , they were lowered .  He said that he feels as if his bp is going a little higher with the med change.  He stated it is running 140/90 ish and would like to know what he should do?     [phone]

## 2018-09-17 NOTE — Telephone Encounter (Signed)
Need  Confirmation and clarification   Of current dosing s  Taking verapamil 180  Per day ? Losartan 2 -25 mg per day ?  Levothyroxine 132mcg  Per day ?  Are we still taking 1/2 of the maxide  Per day?  Due for  tsh level after change of  Dosing  To 100  In  July  ?   What  is pulse doing?Marland Kitchen And how are you feeling ?  Will make recommendation after information obrained

## 2018-09-18 ENCOUNTER — Other Ambulatory Visit: Payer: Self-pay | Admitting: Internal Medicine

## 2018-09-23 ENCOUNTER — Telehealth: Payer: Self-pay | Admitting: Internal Medicine

## 2018-09-23 DIAGNOSIS — E039 Hypothyroidism, unspecified: Secondary | ICD-10-CM

## 2018-09-23 NOTE — Telephone Encounter (Signed)
Apology that my response NOv 12  didn't get to  You. In a timely manner   I Needed to get the dosing correct  Before  Deciding on med change   I would have you stay on the lower dose of levothyroxiine  100  Mcg ( refill x 3 mos if needs one   if you get too much thyroid medicaiton it can cause   Rapid heart rate .  But make sure taking  An hour  before  Anything that isnt water .  NEED  TSH DONE  Help him arrange at any lab  available.   No fasting needed   Ok to go back up on the verapamil to  240. C   And 60..  300 mg per day  Please send in   Refill enough for 6 months. .  Track pulse again    There are other options but at this point I agree  With plan  .

## 2018-09-23 NOTE — Telephone Encounter (Signed)
Current medications: Levothyroxine 112 mcg daily ( was on 164mcg)  Verapamil 240mg  - 1 Tablet daily  ( was on 180mg )  Verapamil 120mg  - 1/2 tablet daily (60mg )   equaling 300 mg of Verapamil daily  Maxide 37.5-25mg  - 1/2 daily  Losartan 25 mg - 2 tablets (50mg ) daily  What is on his med list is an old regimen from last OV 05/20/18, patient changed his medication dosing / admin x 5 days ago due to irregular pulse and what felt like his BP rising. Pulse has since improved after changing his medications. Does not check BP on regular basis.   Please advise Dr Regis Bill, if any further recommendations. thanks.

## 2018-09-23 NOTE — Telephone Encounter (Signed)
Copied from [place] [id number]. Topic: Quick Communication - See Telephone Encounter >> Sep 23, 2018  8:40 AM Blase Mess A wrote: CRM for notification. See Telephone encounter for: 09/23/18.Patient is calling because he was have rapid heart beat. Dr. Regis Bill lowered his dosage. At this time, the patient is requesting Levothyrocine 112mg  and veratamil 2-40mg  1/2 120mg . Please advise [phone]

## 2018-09-23 NOTE — Telephone Encounter (Signed)
See 09/23/18 TE

## 2018-09-23 NOTE — Telephone Encounter (Signed)
Pt. Called back to report he had called last week about his Levothyroxine and his Verapamil. Reports he never heard back from Dr. Regis Bill, so he went back on his old doses - he has been on this for 5 days and now is out of medication. Levothyroxine 112 mcg daily, Verapamil 240mg  tablet 1 and 1/2  Of 120 mg tablet equaling 300 mg of Verapamil daily. States he feels better since he changed the dose, but now he is out. Reports his pulse was irregular and fast, but "is better now." Does not know his BP or pulse. He is taking Maxide 1/2 daily,Losartan 2 25 mg daily. Offered appointment, but states he can't miss anymore work this week, since missed some days last week. Would like his old doses called in to the pharmacy. Please advise.

## 2018-09-24 MED ORDER — LEVOTHYROXINE SODIUM 100 MCG PO TABS: 100.0000 ug | ORAL_TABLET | Freq: Every day | ORAL | 0 refills | Status: DC

## 2018-09-24 MED ORDER — VERAPAMIL HCL ER 120 MG PO TBCR: 60.0000 mg | EXTENDED_RELEASE_TABLET | Freq: Every day | ORAL | 1 refills | Status: DC

## 2018-09-24 MED ORDER — VERAPAMIL HCL ER 240 MG PO TBCR: 240.0000 mg | EXTENDED_RELEASE_TABLET | Freq: Every day | ORAL | 1 refills | Status: DC

## 2018-09-24 NOTE — Telephone Encounter (Signed)
Pt aware of recommendations per Dr Regis Bill Pt requests that Rxs be sent to [facility] - sent in  Pt scheduled for Lab appt 09/26/18 at 345pm Order placed for TSH  Nothing further needed.

## 2018-09-26 ENCOUNTER — Other Ambulatory Visit (INDEPENDENT_AMBULATORY_CARE_PROVIDER_SITE_OTHER): Payer: 59

## 2018-09-26 DIAGNOSIS — E039 Hypothyroidism, unspecified: Secondary | ICD-10-CM

## 2018-09-26 LAB — TSH: TSH: 1.05 u[IU]/mL (ref 0.35–4.50)

## 2018-11-25 ENCOUNTER — Other Ambulatory Visit: Payer: Self-pay | Admitting: Family Medicine

## 2018-11-28 ENCOUNTER — Other Ambulatory Visit: Payer: Self-pay | Admitting: Internal Medicine

## 2018-12-12 ENCOUNTER — Telehealth: Payer: Self-pay

## 2018-12-12 NOTE — Telephone Encounter (Signed)
Notified pt that we do not have any coupons for breo onsite but he can look online for some

## 2018-12-12 NOTE — Telephone Encounter (Signed)
Please advise pt would like coupon  Copied from [place] [id number]. Topic: General - Inquiry >> Dec 12, 2018  9:20 AM Judyann Munson wrote: Reason for CRM: patient Is calling to state the medication BREO ELLIPTA 100-25 MCG/INH AEPB is now expensive due to the coupon the office gave last year has expire. The patient is hoping to have  another coupon for this medication. Please advise

## 2018-12-12 NOTE — Telephone Encounter (Signed)
Please check to see if we have coupon  Here  or help him get one get one on line  He  may need to sign up on line   At some of these programs

## 2019-02-12 ENCOUNTER — Other Ambulatory Visit: Payer: Self-pay | Admitting: Internal Medicine

## 2019-02-21 ENCOUNTER — Other Ambulatory Visit: Payer: Self-pay | Admitting: Internal Medicine

## 2019-02-21 NOTE — Telephone Encounter (Signed)
Scheduled for appt 02/24/19

## 2019-02-21 NOTE — Progress Notes (Signed)
Virtual Visit via Video Note  I connected with@ on 02/24/19 at  8:45 AM EDT by a video enabled telemedicine application and verified that I am speaking with the correct person using two identifiers. Location patient: home Location provider:work  office Persons participating in the virtual visit: patient, provider  WIth national recommendations  regarding COVID 19 pandemic   video visit is advised over in office visit for this patient.  Discussed the limitations of evaluation and management by telemedicine and  availability of in person appointments. The patient expressed understanding and agreed to proceed.   HPI: James Nunez presents for video vist  For med  Evaluation  Needs refill of thyroid medication   Last tsh in nov and in range  No cp sob tachycardia episodes   Bp had been doing well around 130/70 pulse 50 - 60   No low or symptomatic hypotension or syncope   Asthma:: allergy stable  Taking breo qd and as needed cough med at tnight g dm?  Claritin etc.  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 No cp sob syncope   Past Medical History:  Diagnosis Date  . Allergic rhinitis   . Asthma    ?  Marland Kitchen Atrial fibrillation, persistent    Paroxysmal  . 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 Hypothyroidism    s/p hyperthyroid heart failure and RAI rx  . Low HDL (under 40)   . Umbilical hernia    surgery consult no intervention if no sx     Past Surgical History:  Procedure Laterality Date  . no surgical history      Family History  Problem Relation Age of Onset  . Stroke Mother   . Stomach cancer Father   . Diabetes Unknown   . Asthma Unknown        sibs  . Alcohol abuse Unknown        sib    Social History   Tobacco Use  . Smoking status: Never Smoker   . Smokeless tobacco: Never Used  Substance Use Topics  . Alcohol use: No  . Drug use: No      Current Outpatient Medications:  .  albuterol (VENTOLIN HFA) 108 (90 Base) MCG/ACT inhaler, INHALE 1 TO 2 PUFFS BY MOUTH EVERY 6 HOURS AS NEEDED FOR ASTHMA, Disp: 1 Inhaler, Rfl: 3 .  aspirin 81 MG tablet, Take 81 mg by mouth daily., Disp: , Rfl:  .  BREO ELLIPTA 100-25 MCG/INH AEPB, INHALE 1 PUFF INTO THE LUNGS DAILY., Disp: 60 each, Rfl: 3 .  fluticasone (FLONASE) 50 MCG/ACT nasal spray, Place 2 sprays into the nose daily. Prn, Disp: , Rfl:  .  levothyroxine (SYNTHROID) 100 MCG tablet, Take 1 tablet (100 mcg total) by mouth daily., Disp: 90 tablet, Rfl: 1 .  losartan (COZAAR) 25 MG tablet, TAKE 2 TABLETS (50MG  TOTAL) BY MOUTH DAILY, Disp: 180 tablet, Rfl: 0 .  losartan (COZAAR) 50 MG tablet, TAKE 1 TABLET BY MOUTH EVERY DAY, Disp: 90 tablet, Rfl: 1 .  triamterene-hydrochlorothiazide (MAXZIDE-25) 37.5-25 MG tablet, Take 0.5 tablets by mouth daily., Disp: 45 tablet, Rfl: 3 .  verapamil (CALAN-SR) 120 MG CR tablet, Take 0.5 tablets (60 mg total) by mouth at bedtime. Take  with 240mg  tablet, Disp: 45 tablet, Rfl: 1 .  verapamil (CALAN-SR) 240 MG CR tablet, Take 1 tablet (240 mg total) by mouth at bedtime. Take with 60mg  tab., Disp: 90 tablet, Rfl: 1  EXAM: BP Readings from Last 3 Encounters:  05/20/18 140/88  02/11/18 130/72  04/09/17 (!) 150/100    VITALS per patient if applicable: reported 409/73  Visit while in his vehicle stopped  GENERAL: alert, oriented, appears well and in no acute distress  HEENT: atraumatic, conjunttiva clear, no obvious abnormalities on inspection of external nose and ears  NECK: normal movements of the head and neck  LUNGS: on inspection no signs of respiratory distress, breathing rate appears normal, no obvious gross SOB, gasping or wheezing  CV: no obvious cyanosis  MS: moves all visible extremities without noticeable abnormality  PSYCH/NEURO: pleasant  and cooperative, no obvious depression or anxiety, speech and thought processing grossly intact Lab Results  Component Value Date   WBC 7.3 05/20/2018   HGB 15.2 05/20/2018   HCT 46.4 05/20/2018   PLT 182.0 05/20/2018   GLUCOSE 89 05/20/2018   CHOL 179 05/20/2018   TRIG 78.0 05/20/2018   HDL 37.40 (L) 05/20/2018   LDLCALC 126 (H) 05/20/2018   ALT 15 05/20/2018   AST 18 05/20/2018   NA 140 05/20/2018   K 3.5 05/20/2018   CL 103 05/20/2018   CREATININE 0.95 05/20/2018   BUN 8 05/20/2018   CO2 30 05/20/2018   TSH 1.05 09/26/2018   PSA 0.70 05/20/2018   HGBA1C 6.0 05/20/2018   BP Readings from Last 3 Encounters:  05/20/18 140/88  02/11/18 130/72  04/09/17 (!) 150/100     ASSESSMENT AND PLAN:  Discussed the following assessment and plan:  Hypothyroidism, unspecified type - Plan: Basic metabolic panel, CBC with Differential/Platelet, Hemoglobin A1c, Hepatic function panel, Lipid panel, TSH  Medication management - Plan: Basic metabolic panel, CBC with Differential/Platelet, Hemoglobin A1c, Hepatic function panel, Lipid panel, TSH  Essential hypertension - Plan: Basic metabolic panel, CBC with Differential/Platelet, Hemoglobin A1c, Hepatic function panel, Lipid panel, TSH  Fasting hyperglycemia - Plan: Basic metabolic panel, CBC with Differential/Platelet, Hemoglobin A1c, Hepatic function panel, Lipid panel, TSH  Uncomplicated asthma, unspecified asthma severity, unspecified whether persistent - Plan: Basic metabolic panel, CBC with Differential/Platelet, Hemoglobin A1c, Hepatic function panel, Lipid panel, TSH  PAF (paroxysmal atrial fibrillation) (HCC) - Plan: Basic metabolic panel, CBC with Differential/Platelet, Hemoglobin A1c, Hepatic function panel, Lipid panel, TSH  Low HDL (under 40) - Plan: Basic metabolic panel, CBC with Differential/Platelet, Hemoglobin A1c, Hepatic function panel, Lipid panel, TSH  Counseled.   Expectant management and discussion of plan and  treatment with patient with opportunity to ask questions and all were answered. The patient agreed with the plan and demonstrated an understanding of the instructions.  due for labs and cpx  in July  The patient was advised to call back or seek an in-person evaluation if  having concerns .   Shanon Ace, MD

## 2019-02-24 ENCOUNTER — Encounter: Payer: Self-pay | Admitting: Internal Medicine

## 2019-02-24 ENCOUNTER — Other Ambulatory Visit: Payer: Self-pay

## 2019-02-24 ENCOUNTER — Ambulatory Visit (INDEPENDENT_AMBULATORY_CARE_PROVIDER_SITE_OTHER): Payer: 59 | Admitting: Internal Medicine

## 2019-02-24 DIAGNOSIS — J45909 Unspecified asthma, uncomplicated: Secondary | ICD-10-CM

## 2019-02-24 DIAGNOSIS — Z79899 Other long term (current) drug therapy: Secondary | ICD-10-CM

## 2019-02-24 DIAGNOSIS — E039 Hypothyroidism, unspecified: Secondary | ICD-10-CM | POA: Diagnosis not present

## 2019-02-24 DIAGNOSIS — I10 Essential (primary) hypertension: Secondary | ICD-10-CM

## 2019-02-24 DIAGNOSIS — R7301 Impaired fasting glucose: Secondary | ICD-10-CM

## 2019-02-24 DIAGNOSIS — E786 Lipoprotein deficiency: Secondary | ICD-10-CM

## 2019-02-24 DIAGNOSIS — I480 Paroxysmal atrial fibrillation: Secondary | ICD-10-CM

## 2019-02-24 MED ORDER — LEVOTHYROXINE SODIUM 100 MCG PO TABS: 100.0000 ug | ORAL_TABLET | Freq: Every day | ORAL | 1 refills | Status: DC

## 2019-03-04 ENCOUNTER — Other Ambulatory Visit: Payer: Self-pay | Admitting: Internal Medicine

## 2019-03-17 ENCOUNTER — Other Ambulatory Visit: Payer: Self-pay | Admitting: Internal Medicine

## 2019-05-19 ENCOUNTER — Other Ambulatory Visit: Payer: Self-pay | Admitting: Internal Medicine

## 2019-05-29 ENCOUNTER — Other Ambulatory Visit: Payer: Self-pay | Admitting: Internal Medicine

## 2019-05-30 ENCOUNTER — Other Ambulatory Visit: Payer: Self-pay

## 2019-05-30 MED ORDER — LOSARTAN POTASSIUM 25 MG PO TABS: ORAL_TABLET | ORAL | 1 refills | Status: DC

## 2019-08-26 ENCOUNTER — Other Ambulatory Visit: Payer: Self-pay | Admitting: Internal Medicine

## 2019-08-26 NOTE — Telephone Encounter (Signed)
He was  due for cpx and labs in the summer  Please refill the losartan for  90 days  No refills   Schedule  Labs and  cpx ( orders in the system  he can do day of cpx or  before )

## 2019-09-07 ENCOUNTER — Other Ambulatory Visit: Payer: Self-pay | Admitting: Internal Medicine

## 2019-09-17 ENCOUNTER — Encounter: Payer: 59 | Admitting: Internal Medicine

## 2019-10-30 ENCOUNTER — Other Ambulatory Visit: Payer: Self-pay | Admitting: Internal Medicine

## 2019-11-03 ENCOUNTER — Other Ambulatory Visit: Payer: Self-pay | Admitting: Internal Medicine

## 2019-11-10 ENCOUNTER — Encounter: Payer: Self-pay | Admitting: Internal Medicine

## 2019-11-10 ENCOUNTER — Ambulatory Visit (INDEPENDENT_AMBULATORY_CARE_PROVIDER_SITE_OTHER): Payer: Medicare Other | Admitting: Internal Medicine

## 2019-11-10 ENCOUNTER — Encounter

## 2019-11-10 ENCOUNTER — Other Ambulatory Visit: Payer: Self-pay

## 2019-11-10 VITALS — BP 138/82 | HR 95 | Temp 98.1°F | Ht 73.0 in | Wt 195.2 lb

## 2019-11-10 DIAGNOSIS — Z23 Encounter for immunization: Secondary | ICD-10-CM | POA: Diagnosis not present

## 2019-11-10 DIAGNOSIS — E786 Lipoprotein deficiency: Secondary | ICD-10-CM

## 2019-11-10 DIAGNOSIS — I10 Essential (primary) hypertension: Secondary | ICD-10-CM | POA: Diagnosis not present

## 2019-11-10 DIAGNOSIS — Z8679 Personal history of other diseases of the circulatory system: Secondary | ICD-10-CM

## 2019-11-10 DIAGNOSIS — Z79899 Other long term (current) drug therapy: Secondary | ICD-10-CM

## 2019-11-10 DIAGNOSIS — K429 Umbilical hernia without obstruction or gangrene: Secondary | ICD-10-CM

## 2019-11-10 DIAGNOSIS — J45909 Unspecified asthma, uncomplicated: Secondary | ICD-10-CM

## 2019-11-10 DIAGNOSIS — E039 Hypothyroidism, unspecified: Secondary | ICD-10-CM

## 2019-11-10 DIAGNOSIS — Z0000 Encounter for general adult medical examination without abnormal findings: Secondary | ICD-10-CM

## 2019-11-10 LAB — POC URINALSYSI DIPSTICK (AUTOMATED)
Glucose, UA: NEGATIVE
Leukocytes, UA: NEGATIVE
Protein, UA: NEGATIVE
Spec Grav, UA: 1.025 (ref 1.010–1.025)
Urobilinogen, UA: 0.2 E.U./dL
pH, UA: 6 (ref 5.0–8.0)

## 2019-11-10 NOTE — Progress Notes (Signed)
This visit occurred during the SARS-CoV-2 public health emergency.  Safety protocols were in place, including screening questions prior to the visit, additional usage of staff PPE, and extensive cleaning of exam room while observing appropriate contact time as indicated for disinfecting solutions.     Chief Complaint  Patient presents with  . Annual Exam    Pt has no concerns today   . Medication Management    HPI: Patient  James Nunez  64 y.o. comes in today for Preventive Health Care visit  And Chronic disease management    Asthma Dry cough seasonal.  Not bad  Since normal   breo help but  Coupon expired an cost went up to 60$   Bp at home  Below   130 and below and 70   116    Ranges  And feels fine  PAF no sx for years     No sx syncope or  Palpitations on asa   thyroid takes med in am   And bp meds in lunch time   Going to retire  Next July.     Drives truck and unloads   Physical intermittently   Health Maintenance  Topic Date Due  . HIV Screening  06/05/1969  . COLONOSCOPY  11/06/2018  . TETANUS/TDAP  11/06/2018  . INFLUENZA VACCINE  02/04/2020 (Originally 06/07/2019)  . PNA vac Low Risk Adult (2 of 2 - PPSV23) 11/09/2020  . Hepatitis C Screening  Completed  says did colo guard within the last  3 years  Health Maintenance Review LIFESTYLE:  Exercise:   Some  Driving and  Tobacco/ETS: no Alcohol:  no Sugar beverages: Sleep: 6.5  Drug use: no HH of 2  Work: 40 lrs plus   In and out  Truck   ROS:  Strong urine at times  Dark yellow no blood  GEN/ HEENT: No fever, significant weight changes sweats headaches vision problems hearing changes, CV/ PULM; No chest pain shortness of breath  syncope,edema  change in exercise tolerance. GI /GU: No adominal pain, vomiting, change in bowel habits. No blood in the stool. No significant GU symptoms. SKIN/HEME: ,no acute skin rashes suspicious lesions or bleeding. No lymphadenopathy, nodules, masses.  NEURO/ PSYCH:  No  neurologic signs such as weakness numbness. No depression anxiety. IMM/ Allergy: No unusual infections.  Allergy .   REST of 12 system review negative except as per HPI ocass tinglin left leg at night  No weakness  Past Medical History:  Diagnosis Date  . Allergic rhinitis   . Asthma    ?  Marland Kitchen Atrial fibrillation, persistent (HCC)    Paroxysmal  . 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 Hypothyroidism    s/p hyperthyroid heart failure and RAI rx  . Low HDL (under 40)   . Palpitations 05/19/2013  . Umbilical hernia    surgery consult no intervention if no sx     Past Surgical History:  Procedure Laterality Date  . no surgical history      Family History  Problem Relation Age of Onset  . Stroke Mother   . Stomach cancer Father   . Diabetes Unknown   . Asthma Unknown        sibs  . Alcohol abuse Unknown        sib      Outpatient Medications Prior to Visit  Medication Sig Dispense Refill  . albuterol (VENTOLIN HFA) 108 (90 Base) MCG/ACT inhaler INHALE 1 TO 2 PUFFS  BY MOUTH EVERY 6 HOURS AS NEEDED FOR ASTHMA 1 Inhaler 3  . aspirin 81 MG tablet Take 81 mg by mouth daily.    Marland Kitchen BREO ELLIPTA 100-25 MCG/INH AEPB INHALE 1 PUFF INTO THE LUNGS DAILY. 60 each 3  . fluticasone (FLONASE) 50 MCG/ACT nasal spray Place 2 sprays into the nose daily. Prn    . levothyroxine (SYNTHROID) 100 MCG tablet TAKE 1 TABLET BY MOUTH EVERY DAY 90 tablet 1  . losartan (COZAAR) 25 MG tablet TAKE 2 TABLETS BY MOUTH EVERY DAY 180 tablet 0  . losartan (COZAAR) 50 MG tablet TAKE 1 TABLET BY MOUTH EVERY DAY 90 tablet 1  . triamterene-hydrochlorothiazide (MAXZIDE-25) 37.5-25 MG tablet Take 0.5 tablets by mouth daily. 45 tablet 3  . verapamil (CALAN-SR) 120 MG CR tablet TAKE 0.5 TABLETS (60 MG TOTAL) BY MOUTH AT BEDTIME. TAKE WITH 240MG  TABLET 15 tablet 2  . verapamil (CALAN-SR) 240 MG CR tablet TAKE 1 TABLET (240 MG TOTAL) BY MOUTH AT BEDTIME. TAKE WITH 60MG  TAB. 90 tablet 1   No  facility-administered medications prior to visit.     EXAM:  BP 138/82 (BP Location: Right Arm, Patient Position: Sitting, Cuff Size: Normal)   Pulse 95   Temp 98.1 F (36.7 C) (Temporal)   Ht 6\' 1"  (1.854 m)   Wt 195 lb 3.2 oz (88.5 kg)   SpO2 97%   BMI 25.75 kg/m   Body mass index is 25.75 kg/m. Wt Readings from Last 3 Encounters:  11/10/19 195 lb 3.2 oz (88.5 kg)  05/20/18 195 lb (88.5 kg)  02/11/18 199 lb 1.6 oz (90.3 kg)    Physical Exam: Vital signs reviewed [id number] is a well-developed well-nourished alert cooperative    who appearsr stated age in no acute distress.  HEENT: normocephalic atraumatic , Eyes: PERRL EOM's full, conjunctiva clear, Nares: paten,t no deformity discharge or tenderness., Ears: no deformity EAC's 1+ wax  TMs with normal landmarks. Mouth:OP, masked NECK: supple without masses, thyromegaly or bruits. CHEST/PULM:  Clear to auscultation and percussion breath sounds equal no wheeze , rales or rhonchi. No chest wall deformities or tenderness. CV: PMI is nondisplaced, S1 S2 no gallops, murmurs, rubs. Peripheral pulses are full without delay.No JVD .  ABDOMEN: Bowel sounds normal nontender  No guard or rebound, no hepato splenomegal no CVA tenderness.  Small umbi hernia  Reducible  Extremtities:  No clubbing cyanosis or edema, no acute joint swelling or redness no focal atrophy NEURO:  Oriented x3, cranial nerves 3-12 appear to be intact, no obvious focal weakness,gait within normal limits no abnormal reflexes or asymmetrical SKIN: No acute rashes normal turgor, color, no bruising or petechiae. PSYCH: Oriented, good eye contact, no obvious depression anxiety, cognition and judgment appear normal. LN: no cervical axillary inguinal adenopathy  Lab Results  Component Value Date   WBC 7.3 05/20/2018   HGB 15.2 05/20/2018   HCT 46.4 05/20/2018   PLT 182.0 05/20/2018   GLUCOSE 89 05/20/2018   CHOL 179 05/20/2018   TRIG 78.0 05/20/2018   HDL 37.40 (L)  05/20/2018   LDLCALC 126 (H) 05/20/2018   ALT 15 05/20/2018   AST 18 05/20/2018   NA 140 05/20/2018   K 3.5 05/20/2018   CL 103 05/20/2018   CREATININE 0.95 05/20/2018   BUN 8 05/20/2018   CO2 30 05/20/2018   TSH 1.05 09/26/2018   PSA 0.70 05/20/2018   HGBA1C 6.0 05/20/2018    BP Readings from Last 3 Encounters:  11/10/19 138/82  05/20/18  140/88  02/11/18 130/72    Wt Readings from Last 3 Encounters:  11/10/19 195 lb 3.2 oz (88.5 kg)  05/20/18 195 lb (88.5 kg)  02/11/18 199 lb 1.6 oz (90.3 kg)     ASSESSMENT AND PLAN:  Discussed the following assessment and plan:    ICD-10-CM   1. Visit for preventive health examination  123456 Basic metabolic panel    CBC with Differential    Hemoglobin A1c    Hepatic function panel    Lipid panel    PSA    TSH    T4, Free (Thyrox)    POCT Urinalysis Dipstick (Automated)  2. Essential hypertension  99991111 Basic metabolic panel    CBC with Differential    Hemoglobin A1c    Hepatic function panel    Lipid panel    PSA    TSH    T4, Free (Thyrox)    POCT Urinalysis Dipstick (Automated)    Ambulatory referral to Cardiology   rerported good control at home   3. Hypothyroidism, unspecified type  0000000 Basic metabolic panel    CBC with Differential    Hemoglobin A1c    Hepatic function panel    Lipid panel    PSA    TSH    T4, Free (Thyrox)    POCT Urinalysis Dipstick (Automated)  4. Low HDL (under 40)  [id number] Basic metabolic panel    CBC with Differential    Hemoglobin A1c    Hepatic function panel    Lipid panel    PSA    TSH    T4, Free (Thyrox)    POCT Urinalysis Dipstick (Automated)  5. Medication management  123456 Basic metabolic panel    CBC with Differential    Hemoglobin A1c    Hepatic function panel    Lipid panel    PSA    TSH    T4, Free (Thyrox)    POCT Urinalysis Dipstick (Automated)  6. Uncomplicated asthma, unspecified asthma severity, unspecified whether persistent  [id number] Basic metabolic panel     CBC with Differential    Hemoglobin A1c    Hepatic function panel    Lipid panel    PSA    TSH    T4, Free (Thyrox)    POCT Urinalysis Dipstick (Automated)  7. Need for pneumococcal vaccination  Z23 Pneumococcal conjugate vaccine 13-valent  8. Umbilical hernia without obstruction and without gangrene  K42.9    patient observing  not currently causeing sx   9. History of atrial fibrillation  Z86.79 Ambulatory referral to Cardiology   sounds paf and no current sx   pt states cologuard  screen done see if he can get [place] the info cant find in epic. Bp seems controlled  Per hx  Hx of  PAF in  Past    Pt says absolutely no sx  Of such currently  . Disc decision about whether to  intensify to anticoagulation since he is now 63   Now  chads vasc score is 2 .   Could he have "silent" a fib?   Advisability of a monitor  To decide vs  Change from asa to anticoagulation.  Fraser Din agrees   Monitoring and screening labs today .   Patient Care Team: Burnis Medin, MD as PCP - General Leonie Man, MD as Consulting Physician (Cardiology) Patient Instructions  Lab  And urine test today  Will be  Contacted   About cardiology consult about   Advisability of  anticoagulation now that you are 63 .    Glad you are dooing well otherwise .   If all ok then  Yearly  Visit or as needed   Check for coupons for breo   On line updated           Standley Brooking. Azalyn Sliwa M.D.

## 2019-11-10 NOTE — Patient Instructions (Addendum)
Lab  And urine test today  Will be  Contacted   About cardiology consult about   Advisability of anticoagulation now that you are 63 .    Glad you are dooing well otherwise .   If all ok then  Yearly  Visit or as needed   Check for coupons for breo   On line updated

## 2019-11-11 LAB — HEPATIC FUNCTION PANEL
ALT: 18 U/L (ref 0–53)
AST: 18 U/L (ref 0–37)
Albumin: 4 g/dL (ref 3.5–5.2)
Alkaline Phosphatase: 89 U/L (ref 39–117)
Bilirubin, Direct: 0.1 mg/dL (ref 0.0–0.3)
Total Bilirubin: 0.5 mg/dL (ref 0.2–1.2)
Total Protein: 8.1 g/dL (ref 6.0–8.3)

## 2019-11-11 LAB — BASIC METABOLIC PANEL
BUN: 10 mg/dL (ref 6–23)
CO2: 30 mEq/L (ref 19–32)
Calcium: 9.2 mg/dL (ref 8.4–10.5)
Chloride: 101 mEq/L (ref 96–112)
Creatinine, Ser: 0.99 mg/dL (ref 0.40–1.50)
GFR: 91.68 mL/min (ref >60.00)
Glucose, Bld: 107 mg/dL — ABNORMAL HIGH (ref 70–99)
Potassium: 3.4 mEq/L — ABNORMAL LOW (ref 3.5–5.1)
Sodium: 139 mEq/L (ref 135–145)

## 2019-11-11 LAB — LIPID PANEL
Cholesterol: 176 mg/dL (ref 0–200)
HDL: 33.1 mg/dL — ABNORMAL LOW (ref >39.00)
LDL Cholesterol: 112 mg/dL — ABNORMAL HIGH (ref 0–99)
NonHDL: 142.8
Total CHOL/HDL Ratio: 5
Triglycerides: 154 mg/dL — ABNORMAL HIGH (ref 0.0–149.0)
VLDL: 30.8 mg/dL (ref 0.0–40.0)

## 2019-11-11 LAB — CBC WITH DIFFERENTIAL/PLATELET
Basophils Absolute: 0.1 10*3/uL (ref 0.0–0.1)
Basophils Relative: 1 % (ref 0.0–3.0)
Eosinophils Absolute: 0.1 10*3/uL (ref 0.0–0.7)
Eosinophils Relative: 1.9 % (ref 0.0–5.0)
HCT: 48.9 % (ref 39.0–52.0)
Hemoglobin: 16.1 g/dL (ref 13.0–17.0)
Lymphocytes Relative: 32 % (ref 12.0–46.0)
Lymphs Abs: 2.3 10*3/uL (ref 0.7–4.0)
MCHC: 33 g/dL (ref 30.0–36.0)
MCV: 87.6 fl (ref 78.0–100.0)
Monocytes Absolute: 0.7 10*3/uL (ref 0.1–1.0)
Monocytes Relative: 9.6 % (ref 3.0–12.0)
Neutro Abs: 4 10*3/uL (ref 1.4–7.7)
Neutrophils Relative %: 55.5 % (ref 43.0–77.0)
Platelets: 201 10*3/uL (ref 150.0–400.0)
RBC: 5.58 Mil/uL (ref 4.22–5.81)
RDW: 13.7 % (ref 11.5–15.5)
WBC: 7.2 10*3/uL (ref 4.0–10.5)

## 2019-11-11 LAB — TSH: TSH: 0.93 u[IU]/mL (ref 0.35–4.50)

## 2019-11-11 LAB — HEMOGLOBIN A1C: Hgb A1c MFr Bld: 6 % (ref 4.6–6.5)

## 2019-11-11 LAB — PSA: PSA: 0.57 ng/mL (ref 0.10–4.00)

## 2019-11-11 LAB — T4, FREE: Free T4: 1.1 ng/dL (ref 0.60–1.60)

## 2019-11-13 ENCOUNTER — Other Ambulatory Visit: Payer: Self-pay | Admitting: Internal Medicine

## 2019-11-14 ENCOUNTER — Other Ambulatory Visit: Payer: Self-pay

## 2019-11-14 DIAGNOSIS — R319 Hematuria, unspecified: Secondary | ICD-10-CM

## 2019-11-14 DIAGNOSIS — E876 Hypokalemia: Secondary | ICD-10-CM

## 2019-11-22 ENCOUNTER — Other Ambulatory Visit: Payer: Self-pay | Admitting: Internal Medicine

## 2019-11-23 NOTE — Progress Notes (Signed)
Cardiology Office Note:   Date:  11/24/2019  NAME:  James Nunez    MRN: [id number] DOB:  17-Feb-1956   PCP:  Burnis Medin, MD  Cardiologist:  Evalina Field, MD   Referring MD: Burnis Medin, MD   Chief Complaint  Patient presents with  . New Patient (Initial Visit)  . Atrial Fibrillation    Hx.   History of Present Illness:   James Nunez is a 64 y.o. male with a hx of paroxysmal atrial fibrillation, hypertension, hypothyroidism who is being seen today for the evaluation of atrial fibrillation at the request of Panosh, Standley Brooking, MD. Had paroxysmal Afib in the past and followed by Cardiology. EF 2014 normal with mildly dilated LA. Now that he is 22 his CHADSVASC score is now 2 which merits AC.   He reports he was diagnosed with atrial fibrillation 15 years ago.  He states this was in the setting of hyperthyroidism.  Unfortunately I am unable to review the records as this was before electronic records.  He reports his thyroid taken out he remains on Synthroid.  He was evaluated by cardiology in 2014 and noted to have paroxysms of atrial fibrillation.  It appears he was just left on diltiazem and has had no further episodes.  He reports his symptoms of atrial fibrillation include palpitations, rapid heart sensation, fatigue and shortness of breath.  He reports he cannot remember the last time he had any of the symptoms.  His main cardiovascular disease risk factors include hypertension.  He is a never smoker and has no strong family history of cardiovascular disease.  He is never had a myocardial infarction or stroke.  He is not diabetic and no history of congestive heart failure.  Problem List 1. Paroxysmal Atrial fibrillation  2. HTN   Past Medical History: Past Medical History:  Diagnosis Date  . Allergic rhinitis   . Asthma    ?  Marland Kitchen Atrial fibrillation, persistent (HCC)    Paroxysmal  . 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 Hypertension   .  Hypothyroidism    s/p hyperthyroid heart failure and RAI rx  . Low HDL (under 40)   . Palpitations 05/19/2013  . Umbilical hernia    surgery consult no intervention if no sx     Past Surgical History: Past Surgical History:  Procedure Laterality Date  . ABDOMINAL SURGERY    . no surgical history      Current Medications: Current Meds  Medication Sig  . albuterol (VENTOLIN HFA) 108 (90 Base) MCG/ACT inhaler INHALE 1 TO 2 PUFFS BY MOUTH EVERY 6 HOURS AS NEEDED FOR ASTHMA  . levothyroxine (SYNTHROID) 100 MCG tablet TAKE 1 TABLET BY MOUTH EVERY DAY  . losartan (COZAAR) 25 MG tablet TAKE 2 TABLETS BY MOUTH EVERY DAY  . verapamil (CALAN-SR) 120 MG CR tablet TAKE 0.5 TABLETS (60 MG TOTAL) BY MOUTH AT BEDTIME. TAKE WITH 240MG  TABLET  . verapamil (CALAN-SR) 240 MG CR tablet TAKE 1 TABLET (240 MG TOTAL) BY MOUTH AT BEDTIME. TAKE WITH 60MG  TAB.  . [DISCONTINUED] aspirin 81 MG tablet Take 81 mg by mouth daily.  . [DISCONTINUED] BREO ELLIPTA 100-25 MCG/INH AEPB INHALE 1 PUFF INTO THE LUNGS DAILY.  . [DISCONTINUED] fluticasone (FLONASE) 50 MCG/ACT nasal spray Place 2 sprays into the nose daily. Prn  . [DISCONTINUED] losartan (COZAAR) 50 MG tablet TAKE 1 TABLET BY MOUTH EVERY DAY  . [DISCONTINUED] triamterene-hydrochlorothiazide (MAXZIDE-25) 37.5-25 MG tablet Take 0.5  tablets by mouth daily.     Allergies:    Simvastatin and Dyazide [hydrochlorothiazide w-triamterene]   Social History: Social History   Socioeconomic History  . Marital status: Married    Spouse name: Not on file  . Number of children: 2  . Years of education: Not on file  . Highest education level: Not on file  Occupational History  . Occupation: Truck Education administrator: [place]: [place]  Tobacco Use  . Smoking status: Never Smoker  . Smokeless tobacco: Never Used  Substance and Sexual Activity  . Alcohol use: No  . Drug use: No  . Sexual activity: Not on file  Other Topics Concern  . Not  on file  Social History Narrative   Married, 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 Long Hours. 50 hours per week.   Does physical loading.  [place]    Social Determinants of Health   Financial Resource Strain:   . Difficulty of Paying Living Expenses: Not on file  Food Insecurity:   . Worried About Charity fundraiser in the Last Year: Not on file  . Ran Out of Food in the Last Year: Not on file  Transportation Needs:   . Lack of Transportation (Medical): Not on file  . Lack of Transportation (Non-Medical): Not on file  Physical Activity:   . Days of Exercise per Week: Not on file  . Minutes of Exercise per Session: Not on file  Stress:   . Feeling of Stress : Not on file  Social Connections:   . Frequency of Communication with Friends and Family: Not on file  . Frequency of Social Gatherings with Friends and Family: Not on file  . Attends Religious Services: Not on file  . Active Member of Clubs or Organizations: Not on file  . Attends Archivist Meetings: Not on file  . Marital Status: Not on file     Family History: The patient's family history includes Alcohol abuse in an other family member; Asthma in an other family member; Diabetes in his mother and another family member; Stomach cancer in his father; Stroke in his mother.  ROS:   All other ROS reviewed and negative. Pertinent positives noted in the HPI.     EKGs/Labs/Other Studies Reviewed:   The following studies were personally reviewed by me today:  EKG:  EKG is ordered today.  The ekg ordered today demonstrates sinus bradycardia, heart rate 46, nonspecific ST-T changes, no evidence of prior infarction, and was personally reviewed by me.   Recent Labs: 11/10/2019: ALT 18; BUN 10; Creatinine, Ser 0.99; Hemoglobin 16.1; Platelets 201.0; Potassium 3.4; Sodium 139; TSH 0.93   Recent Lipid Panel    Component Value  Date/Time   CHOL 176 11/10/2019 1636   TRIG 154.0 (H) 11/10/2019 1636   HDL 33.10 (L) 11/10/2019 1636   CHOLHDL 5 11/10/2019 1636   VLDL 30.8 11/10/2019 1636   LDLCALC 112 (H) 11/10/2019 1636    Physical Exam:   VS:  BP (!) 152/78 (BP Location: Left Arm, Patient Position: Sitting, Cuff Size: Normal)   Pulse (!) 46   Temp (!) 97.2 F (36.2 C)   Ht 6\' 3"  (1.905 m)   Wt 195 lb (88.5 kg)   BMI 24.37 kg/m    Wt Readings from Last 3 Encounters:  11/24/19 195 lb (88.5 kg)  11/10/19 195 lb 3.2 oz (88.5 kg)  05/20/18 195 lb (88.5 kg)    General: Well nourished, well developed, in no acute distress Heart: Atraumatic, normal size  Eyes: PEERLA, EOMI  Neck: Supple, no JVD Endocrine: No thryomegaly Cardiac: Normal S1, S2; RRR; no murmurs, rubs, or gallops Lungs: Clear to auscultation bilaterally, no wheezing, rhonchi or rales  Abd: Soft, nontender, no hepatomegaly  Ext: No edema, pulses 2+ Musculoskeletal: No deformities, BUE and BLE strength normal and equal Skin: Warm and dry, no rashes   Neuro: Alert and oriented to person, place, time, and situation, CNII-XII grossly intact, no focal deficits  Psych: Normal mood and affect   ASSESSMENT:   James Nunez is a 64 y.o. male who presents for the following: 1. Paroxysmal atrial fibrillation (HCC)   2. Essential hypertension     PLAN:   1. Paroxysmal atrial fibrillation (HCC) -CHA2DS2-VASc score is 2.  This merits anticoagulation.  His initial diagnosis was in the setting of hyperthyroidism some 15 years ago.  However in 2014 he was noted to have paroxysmal atrial fibrillation.  He has had no further episodes for the past few years.  Given that he did have recurrence I do favor anticoagulation.  We will stop aspirin and start Xarelto 20 mg nightly. -I think we should also pursue a 7-day ZIO patch to determine if he is actually having atrial fibrillation.  I am a bit curious to see if he is having asymptomatic episodes.  We will  pursue this.  2. Essential hypertension -BP a bit elevated today.  Reports at home it is within normal limits.  No change in medications today.  Disposition: Return in about 4 months (around 03/23/2020).  Medication Adjustments/Labs and Tests Ordered: Current medicines are reviewed at length with the patient today.  Concerns regarding medicines are outlined above.  Orders Placed This Encounter  Procedures  . LONG TERM MONITOR-LIVE TELEMETRY (3-14 DAYS)  . EKG 12-Lead   Meds ordered this encounter  Medications  . rivaroxaban (XARELTO) 20 MG TABS tablet    Sig: Take 1 tablet (20 mg total) by mouth daily with supper.    Dispense:  30 tablet    Refill:  3    Patient Instructions  Medication Instructions:  Stop Aspirin Start Xarelto 20 mg daily. *If you need a refill on your cardiac medications before your next appointment, please call your pharmacy*  Testing/Procedures: Your physician has recommended that you wear a 7 DAY ZIO-PATCH monitor. The Zio patch cardiac monitor continuously records heart rhythm data for up to 14 days, this is for patients being evaluated for multiple types heart rhythms. For the first 24 hours post application, please avoid getting the Zio monitor wet in the shower or by excessive sweating during exercise. After that, feel free to carry on with regular activities. Keep soaps and lotions away from the ZIO XT Patch.  This will be mailed to you, please expect 7-10 days to receive.  placed at our [facility] location - [address].         Follow-Up: At [facility], you and your health needs are our priority.  As part of our continuing mission to provide you with exceptional heart care, we have created designated Provider Care Teams.  These Care Teams include your primary Cardiologist (physician) and Advanced Practice Providers (APPs -  Physician Assistants and Nurse Practitioners) who all work together to provide you with the care  you need,  when you need it.  Your next appointment:   4 month(s)  The format for your next appointment:   In Person  Provider:   Eleonore Chiquito, MD       Signed, Addison Naegeli. Audie Box, [place]  [address] [place], [place] [address] [phone]  11/24/2019 8:24 AM

## 2019-11-24 ENCOUNTER — Other Ambulatory Visit: Payer: Self-pay

## 2019-11-24 ENCOUNTER — Ambulatory Visit: Payer: 59 | Admitting: Cardiovascular Disease

## 2019-11-24 ENCOUNTER — Other Ambulatory Visit (INDEPENDENT_AMBULATORY_CARE_PROVIDER_SITE_OTHER): Payer: 59

## 2019-11-24 ENCOUNTER — Encounter: Payer: Self-pay | Admitting: Cardiovascular Disease

## 2019-11-24 ENCOUNTER — Encounter: Payer: Self-pay | Admitting: *Deleted

## 2019-11-24 VITALS — BP 152/78 | HR 46 | Temp 97.2°F | Ht 75.0 in | Wt 195.0 lb

## 2019-11-24 DIAGNOSIS — I10 Essential (primary) hypertension: Secondary | ICD-10-CM | POA: Diagnosis not present

## 2019-11-24 DIAGNOSIS — R319 Hematuria, unspecified: Secondary | ICD-10-CM | POA: Diagnosis not present

## 2019-11-24 DIAGNOSIS — E876 Hypokalemia: Secondary | ICD-10-CM | POA: Diagnosis not present

## 2019-11-24 DIAGNOSIS — I480 Paroxysmal atrial fibrillation: Secondary | ICD-10-CM | POA: Diagnosis not present

## 2019-11-24 LAB — URINALYSIS, ROUTINE W REFLEX MICROSCOPIC
Bilirubin Urine: NEGATIVE
Hgb urine dipstick: NEGATIVE
Ketones, ur: NEGATIVE
Leukocytes,Ua: NEGATIVE
Nitrite: NEGATIVE
RBC / HPF: NONE SEEN (ref 0–?)
Specific Gravity, Urine: >=1.03 — AB (ref 1.000–1.030)
Total Protein, Urine: NEGATIVE
Urine Glucose: NEGATIVE
Urobilinogen, UA: 0.2 (ref 0.0–1.0)
WBC, UA: NONE SEEN (ref 0–?)
pH: 5.5 (ref 5.0–8.0)

## 2019-11-24 LAB — BASIC METABOLIC PANEL
BUN: 10 mg/dL (ref 6–23)
CO2: 26 mEq/L (ref 19–32)
Calcium: 9.4 mg/dL (ref 8.4–10.5)
Chloride: 104 mEq/L (ref 96–112)
Creatinine, Ser: 1.06 mg/dL (ref 0.40–1.50)
GFR: 84.72 mL/min (ref >60.00)
Glucose, Bld: 106 mg/dL — ABNORMAL HIGH (ref 70–99)
Potassium: 3.8 mEq/L (ref 3.5–5.1)
Sodium: 141 mEq/L (ref 135–145)

## 2019-11-24 LAB — MAGNESIUM: Magnesium: 1.7 mg/dL (ref 1.5–2.5)

## 2019-11-24 MED ORDER — RIVAROXABAN 20 MG PO TABS: 20.0000 mg | ORAL_TABLET | Freq: Every day | ORAL | 3 refills | Status: DC

## 2019-11-24 NOTE — Patient Instructions (Signed)
Medication Instructions:  Stop Aspirin Start Xarelto 20 mg daily. *If you need a refill on your cardiac medications before your next appointment, please call your pharmacy*  Testing/Procedures: Your physician has recommended that you wear a 7 DAY ZIO-PATCH monitor. The Zio patch cardiac monitor continuously records heart rhythm data for up to 14 days, this is for patients being evaluated for multiple types heart rhythms. For the first 24 hours post application, please avoid getting the Zio monitor wet in the shower or by excessive sweating during exercise. After that, feel free to carry on with regular activities. Keep soaps and lotions away from the ZIO XT Patch.  This will be mailed to you, please expect 7-10 days to receive.  placed at our [facility] location - [address].         Follow-Up: At [facility], you and your health needs are our priority.  As part of our continuing mission to provide you with exceptional heart care, we have created designated Provider Care Teams.  These Care Teams include your primary Cardiologist (physician) and Advanced Practice Providers (APPs -  Physician Assistants and Nurse Practitioners) who all work together to provide you with the care you need, when you need it.  Your next appointment:   4 month(s)  The format for your next appointment:   In Person  Provider:   Eleonore Chiquito, MD

## 2019-11-24 NOTE — Progress Notes (Signed)
Patient ID: James Nunez, male   DOB: [birth year], 64 y.o.   MRN: [id number]  Patient enrolled for a 7 day ZIO AT long term monitor- Live Telemetry to be mailed to the patients home.

## 2019-11-26 NOTE — Progress Notes (Signed)
As  you can see potassium is now normal  and urine is clear    continue

## 2019-11-27 ENCOUNTER — Encounter: Payer: Self-pay | Admitting: *Deleted

## 2019-11-27 NOTE — Progress Notes (Signed)
Patient ID: James Nunez, male   DOB: Nov 20, 1955, 64 y.o.   MRN: [id number] Patient called [employer] and cancelled enrollment for ZIO AT long term monitor-Live Telemetry.

## 2019-11-27 NOTE — Progress Notes (Signed)
Patient ID: James Nunez, male   DOB: 02-10-1956, 64 y.o.   MRN: [id number] Patient was contacted by Carren Rang at [facility], [phone]), to review out of pocket costs of ZIO AT long term monitor-Live Telemetry.  Patient wishes to hold off for a moment before receiving the device.  He said he will give her a call back to let her know if he wishes to proceed.

## 2019-12-11 ENCOUNTER — Telehealth: Payer: Self-pay | Admitting: Internal Medicine

## 2019-12-11 NOTE — Telephone Encounter (Signed)
Disregard

## 2019-12-15 ENCOUNTER — Other Ambulatory Visit: Payer: 59

## 2019-12-19 ENCOUNTER — Other Ambulatory Visit: Payer: 59

## 2020-01-21 ENCOUNTER — Other Ambulatory Visit: Payer: Self-pay | Admitting: Internal Medicine

## 2020-02-11 ENCOUNTER — Other Ambulatory Visit: Payer: Self-pay

## 2020-02-11 ENCOUNTER — Telehealth: Payer: Self-pay | Admitting: Internal Medicine

## 2020-02-11 MED ORDER — ALBUTEROL SULFATE HFA 108 (90 BASE) MCG/ACT IN AERS: INHALATION_SPRAY | RESPIRATORY_TRACT | 3 refills | Status: DC

## 2020-02-11 NOTE — Telephone Encounter (Signed)
New prescription has been sent to the pharmacy requested.

## 2020-02-11 NOTE — Telephone Encounter (Signed)
Pt call and want a new prescription for albuterol (VENTOLIN HFA) 108 (90 Base sent to  [facility], [place] Phone:  [phone]  Fax:  [phone]

## 2020-02-14 ENCOUNTER — Other Ambulatory Visit: Payer: Self-pay | Admitting: Internal Medicine

## 2020-03-23 ENCOUNTER — Ambulatory Visit: Payer: 59 | Admitting: Cardiovascular Disease

## 2020-05-07 ENCOUNTER — Other Ambulatory Visit: Payer: Self-pay | Admitting: Internal Medicine

## 2020-05-09 ENCOUNTER — Other Ambulatory Visit: Payer: Self-pay | Admitting: Internal Medicine

## 2020-07-15 ENCOUNTER — Other Ambulatory Visit: Payer: Self-pay | Admitting: Internal Medicine

## 2020-09-07 ENCOUNTER — Telehealth: Payer: Self-pay | Admitting: Internal Medicine

## 2020-09-07 NOTE — Telephone Encounter (Signed)
dont know how to answer this ?s   What is he really asking  Please have someone call him and get mor information  If  Complex make a video visit

## 2020-09-07 NOTE — Telephone Encounter (Signed)
Pt is calling in stating that he will be flying next week to [place] and would like to know with his health condition if it would be okay for him to do so.  Pt would like to have a call back to let him know.

## 2020-09-09 NOTE — Telephone Encounter (Signed)
Called patient to get clarification.   He stated that he has high blood pressure an irregular heartbeat.   So he wants to know is he healthy enough to fly to [place].   He states that he feels fine, he is asking to reassure his daughter

## 2020-09-09 NOTE — Telephone Encounter (Signed)
Pt.notified

## 2020-09-09 NOTE — Telephone Encounter (Signed)
If bp and heart rate is  Controlled   No restrictions \ However  prudent to get a covid booster (if not done already ) with a mrna  vaccine either  Coca[employer]a or moderna  Before travel exposures .  To boost immunity against covid infection before travel as possible . It has  been more than 6 months since your covid vaccine    I also advise influenza vaccine .

## 2020-10-28 ENCOUNTER — Other Ambulatory Visit: Payer: Self-pay | Admitting: Internal Medicine

## 2020-10-31 ENCOUNTER — Other Ambulatory Visit: Payer: Self-pay | Admitting: Internal Medicine

## 2020-11-17 ENCOUNTER — Other Ambulatory Visit: Payer: Self-pay | Admitting: Internal Medicine

## 2020-11-24 ENCOUNTER — Other Ambulatory Visit: Payer: Self-pay | Admitting: Internal Medicine

## 2020-12-09 ENCOUNTER — Other Ambulatory Visit: Payer: Self-pay | Admitting: Internal Medicine

## 2020-12-28 ENCOUNTER — Other Ambulatory Visit: Payer: Self-pay | Admitting: Internal Medicine

## 2021-01-12 ENCOUNTER — Telehealth: Payer: Self-pay | Admitting: Emergency Medicine

## 2021-01-12 ENCOUNTER — Other Ambulatory Visit: Payer: Self-pay | Admitting: Internal Medicine

## 2021-01-12 NOTE — Telephone Encounter (Signed)
[facility] pharmacy notified our office that Losartan of all doses are on back order with estimated arrival time. Can the Losartan 25 mg 2 tab QD switch to another alternative.

## 2021-01-12 NOTE — Telephone Encounter (Signed)
Inform patient that pharmacy says losartan is not available at this time.  We are going to substitute a similar but not exactly the same medication Substitute valsartan 80 mg 1 p.o. daily dispense 30 no refills temporarily.  Let us know if you are having problems taking it.

## 2021-01-13 ENCOUNTER — Other Ambulatory Visit: Payer: Self-pay

## 2021-01-13 MED ORDER — VALSARTAN 80 MG PO TABS: 80.0000 mg | ORAL_TABLET | Freq: Every day | ORAL | 0 refills | Status: DC

## 2021-01-13 NOTE — Telephone Encounter (Signed)
Sent Valsartan 80 mg to [facility] on [address].   Spoke with patient to make aware of changes.

## 2021-02-08 ENCOUNTER — Other Ambulatory Visit: Payer: Self-pay | Admitting: Internal Medicine

## 2021-03-05 ENCOUNTER — Other Ambulatory Visit: Payer: Self-pay | Admitting: Internal Medicine

## 2021-03-30 ENCOUNTER — Other Ambulatory Visit: Payer: Self-pay | Admitting: Internal Medicine

## 2021-04-25 ENCOUNTER — Other Ambulatory Visit: Payer: Self-pay | Admitting: Internal Medicine

## 2021-04-27 ENCOUNTER — Other Ambulatory Visit: Payer: Self-pay | Admitting: Internal Medicine

## 2021-05-11 ENCOUNTER — Encounter: Payer: Self-pay | Admitting: Internal Medicine

## 2021-05-20 ENCOUNTER — Other Ambulatory Visit: Payer: Self-pay | Admitting: Internal Medicine

## 2021-06-13 ENCOUNTER — Encounter: Payer: 59 | Admitting: Internal Medicine

## 2021-06-16 ENCOUNTER — Other Ambulatory Visit: Payer: Self-pay | Admitting: Internal Medicine

## 2021-06-17 ENCOUNTER — Other Ambulatory Visit: Payer: Self-pay

## 2021-06-19 NOTE — Progress Notes (Signed)
Chief Complaint  Patient presents with   Annual Exam   Medication Management   Follow-up     HPI: James Nunez 65 y.o. comes in today for Preventive Medicare exam/ wellness  and med evaluation.  BP: mostly ok 130  -140/70 Hx of AF  given anticoag per cards  as he has aged  but zio patch was 600 $ so didn't get mobnitor   and didn't get back to viist or plan  taking ASA  Thyroid : meds no sx Asthma  uses inhaler about 2 x per week. [place]sua at night   after mowing lawn.  Would like refills in 90 days  Retired august  last year. Doing well.   Health Maintenance  Topic Date Due   Zoster Vaccines- Shingrix (1 of 2) Never done   COLONOSCOPY (Pts 45-58yr Insurance coverage will need to be confirmed)  11/06/2018   TETANUS/TDAP  11/06/2018   COVID-19 Vaccine (2 - Janssen risk series) 03/13/2020   PNA vac Low Risk Adult (2 of 2 - PPSV23) 11/09/2020   INFLUENZA VACCINE  06/06/2021   Hepatitis C Screening  Completed   HPV VACCINES  Aged Out   Health Maintenance Review LIFESTYLE:  Exercise:   walk daily .  Tobacco/ETS: 2 Alcohol:  n Sugar beverages: kool ade. Lunch an dinner.  Sleep:  6 hours  Drug use: no HH: 2   Hearing: ok  Vision:  No limitations at present . Last eye check UTD Safety:  Has smoke detector and wears seat belts.  No excess sun exposure. Sees dentist regularly. Memory: Felt to be good  ,  Depression: No anhedonia unusual crying or depressive symptoms Nutrition: Eats well balanced diet; adequate calcium and vitamin D. No swallowing chewing problems. Other healthcare providers:  Reviewed today . Preventive parameters:   Reviewed  ADLS:   There are no problems or need for assistance  driving, feeding, obtaining food, dressing, toileting and bathing, managing money using phone. he is independent.    ROS:  See hpi    Past Medical History:  Diagnosis Date   Allergic rhinitis    Asthma    ?   Atrial fibrillation, persistent (HCC)    Paroxysmal    Cardiomyopathy    related to hyperthyroid condition   Hypertension    Hypothyroidism    s/p hyperthyroid heart failure and RAI rx   Low HDL (under 40)    Palpitations [id number]  Umbilical hernia    surgery consult no intervention if no sx     Family History  Problem Relation Age of Onset   Stroke Mother    Diabetes Mother    Stomach cancer Father    Diabetes Other    Asthma Other        sibs   Alcohol abuse Other        sib    Social History   Socioeconomic History   Marital status: Married    Spouse name: Not on file   Number of children: 2   Years of education: Not on file   Highest education level: Not on file  Occupational History   Occupation: Truck dEducation administrator AGM      Comment: [place] Tobacco Use   Smoking status: Never   Smokeless tobacco: Never  Vaping Use   Vaping Use: Never used  Substance and Sexual Activity   Alcohol use: No   Drug use: No   Sexual activity: Not on file  Other Topics Concern   Not on file  Social History Narrative   Married, 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 Long Hours. 50 hours per week.   Does physical loading.  [place]    Social Determinants of Radio broadcast assistant Strain: Not on file  Food Insecurity: Not on file  Transportation Needs: Not on file  Physical Activity: Not on file  Stress: Not on file  Social Connections: Not on file    Outpatient Encounter Medications as of 06/20/2021  Medication Sig   albuterol (VENTOLIN HFA) 108 (90 Base) MCG/ACT inhaler INHALE 1 TO 2 PUFFS BY MOUTH EVERY 6 HOURS AS NEEDED FOR ASTHMA   levothyroxine (SYNTHROID) 100 MCG tablet TAKE 1 TABLET BY MOUTH EVERY DAY   losartan (COZAAR) 25 MG tablet TAKE 2 TABLETS BY MOUTH EVERY DAY   rivaroxaban (XARELTO) 20 MG TABS tablet Take 1 tablet (20 mg total) by mouth daily with supper.   valsartan (DIOVAN) 80 MG tablet  TAKE 1 TABLET BY MOUTH EVERY DAY   verapamil (CALAN-SR) 120 MG CR tablet TAKE 1/2 TABLET BY MOUTH AT BEDTIME TAKE WITH 240 MG TABLET   verapamil (CALAN-SR) 240 MG CR tablet TAKE 1 TABLET (240 MG TOTAL) BY MOUTH AT BEDTIME. TAKE WITH '60MG'$  TAB.   No facility-administered encounter medications on file as of 06/20/2021.    EXAM:  BP 140/70 (BP Location: Left Arm, Patient Position: Sitting, Cuff Size: Normal)   Pulse (!) 54   Temp 98.6 F (37 C) (Oral)   Ht '6\' 3"'$  (1.905 m)   Wt 190 lb 3.2 oz (86.3 kg)   SpO2 97%   BMI 23.77 kg/m   Body mass index is 23.77 kg/m.  Physical Exam: Vital signs reviewed [id number] is a well-developed well-nourished alert cooperative   who appears stated age in no acute distress.  HEENT: normocephalic atraumatic , Eyes: PERRL EOM's full, conjunctiva clear, Nares: paten,t no deformity discharge or tenderness., Ears: no deformity EAC's clear TMs with normal landmarks. Mouth: cmasked  NECK: supple without masses, thyromegaly or bruits. CHEST/PULM:  Clear to auscultation and percussion breath sounds equal no wheeze , rales or rhonchi. No chest wall deformities or tenderness. CV: PMI is nondisplaced, S1 S2 no gallops, murmurs, rubs. Peripheral pulses are full without delay.No JVD .  ABDOMEN: Bowel sounds normal nontender  No guard or rebound, no hepato splenomegal no CVA tenderness.   Extremtities:  No clubbing cyanosis or edema, no acute joint swelling or redness no focal atrophy NEURO:  Oriented x3, cranial nerves 3-12 appear to be intact, no obvious focal weakness,gait within normal limits no abnormal reflexes or asymmetrical SKIN: No acute rashes normal turgor, color, no bruising or petechiae. PSYCH: Oriented, good eye contact, no obvious depression anxiety, cognition and judgment appear normal. LN: no cervical axillary inguinal adenopathy No noted deficits in memory, attention, and speech.   Lab Results  Component Value Date   WBC 5.3 06/20/2021   HGB 14.6  06/20/2021   HCT 44.6 06/20/2021   PLT 183.0 06/20/2021   GLUCOSE 103 (H) 06/20/2021   CHOL 168 06/20/2021   TRIG 112.0 06/20/2021   HDL 33.20 (L) 06/20/2021   LDLCALC 112 (H) 06/20/2021   ALT 15 06/20/2021   AST 19 06/20/2021   NA 140 06/20/2021   K 3.7 06/20/2021   CL 103 06/20/2021   CREATININE 1.02 06/20/2021  BUN 12 06/20/2021   CO2 29 06/20/2021   TSH 0.54 06/20/2021   PSA 0.57 11/10/2019   HGBA1C 6.0 06/20/2021  Had  fruit and water today   ASSESSMENT AND PLAN:  Discussed the following assessment and plan:  Visit for preventive health examination  Essential hypertension - Plan: Basic metabolic panel, Hemoglobin A1c, CBC with Differential/Platelet, Hepatic function panel, Lipid panel, TSH, TSH, Lipid panel, Hepatic function panel, CBC with Differential/Platelet, Hemoglobin [id number], Basic metabolic panel  Hypothyroidism, unspecified type - Plan: Basic metabolic panel, Hemoglobin A1c, CBC with Differential/Platelet, Hepatic function panel, Lipid panel, TSH, TSH, Lipid panel, Hepatic function panel, CBC with Differential/Platelet, Hemoglobin [id number], Basic metabolic panel  Low HDL (under 40) - Plan: Basic metabolic panel, Hemoglobin A1c, CBC with Differential/Platelet, Hepatic function panel, Lipid panel, TSH, TSH, Lipid panel, Hepatic function panel, CBC with Differential/Platelet, Hemoglobin [id number], Basic metabolic panel  Medication management  History of atrial fibrillation - Plan: Basic metabolic panel, Hemoglobin A1c, CBC with Differential/Platelet, Hepatic function panel, Lipid panel, TSH, TSH, Lipid panel, Hepatic function panel, CBC with Differential/Platelet, Hemoglobin [id number], Basic metabolic panel  Hyperglycemia - Plan: Basic metabolic panel, Hemoglobin A1c, CBC with Differential/Platelet, Hepatic function panel, Lipid panel, TSH, TSH, Lipid panel, Hepatic function panel, CBC with Differential/Platelet, Hemoglobin [id number], Basic metabolic panel  Never got evaluation because of  cost and other reasons   I will send a message to dr Audie Box   so as to have a follow up. Now that he is on medicare plan  perhaps  more affordable monitoring may be available.  He is currently taking ASA  no persistent sx of AF but has risk  Patient Care Team: Anabia Weatherwax, Standley Brooking, MD as PCP - General O'Neal, Cassie Freer, MD as PCP - Cardiology (Cardiology) Leonie Man, MD as Consulting Physician (Cardiology)  Patient Instructions  Good to see  you today . Continue lifestyle intervention healthy eating and exercise .  Will touch base with cardiology again  about   appropriate follow up.   Stay on asa for now.   Try  inhaler before mowing . If breathing an issue  getting worse get back with [place] earlier.   Health Maintenance, Male Adopting a healthy lifestyle and getting preventive care are important in promoting health and wellness. Ask your health care provider about: The right schedule for you to have regular tests and exams. Things you can do on your own to prevent diseases and keep yourself healthy. What should I know about diet, weight, and exercise? Eat a healthy diet  Eat a diet that includes plenty of vegetables, fruits, low-fat dairy products, and lean protein. Do not eat a lot of foods that are high in solid fats, added sugars, or sodium.  Maintain a healthy weight Body mass index (BMI) is a measurement that can be used to identify possible weight problems. It estimates body fat based on height and weight. Your health care provider can help determine your BMI and help you achieve or maintain ahealthy weight. Get regular exercise Get regular exercise. This is one of the most important things you can do for your health. Most adults should: Exercise for at least 150 minutes each week. The exercise should increase your heart rate and make you sweat (moderate-intensity exercise). Do strengthening exercises at least twice a week. This is in addition to the moderate-intensity  exercise. Spend less time sitting. Even light physical activity can be beneficial. Watch cholesterol and blood lipids Have your blood tested for lipids and cholesterol at  67 years of age, then havethis test every 5 years. You may need to have your cholesterol levels checked more often if: Your lipid or cholesterol levels are high. You are older than 67 years of age. You are at high risk for heart disease. What should I know about cancer screening? Many types of cancers can be detected early and may often be prevented. Depending on your health history and family history, you may need to have cancer screening at various ages. This may include screening for: Colorectal cancer. Prostate cancer. Skin cancer. Lung cancer. What should I know about heart disease, diabetes, and high blood pressure? Blood pressure and heart disease High blood pressure causes heart disease and increases the risk of stroke. This is more likely to develop in people who have high blood pressure readings, are of African descent, or are overweight. Talk with your health care provider about your target blood pressure readings. Have your blood pressure checked: Every 3-5 years if you are 41-96 years of age. Every year if you are 58 years old or older. If you are between the ages of 56 and 24 and are a current or former smoker, ask your health care provider if you should have a one-time screening for abdominal aortic aneurysm (AAA). Diabetes Have regular diabetes screenings. This checks your fasting blood sugar level. Have the screening done: Once every three years after age 56 if you are at a normal weight and have a low risk for diabetes. More often and at a younger age if you are overweight or have a high risk for diabetes. What should I know about preventing infection? Hepatitis B If you have a higher risk for hepatitis B, you should be screened for this virus. Talk with your health care provider to find out if you are  at risk forhepatitis B infection. Hepatitis C Blood testing is recommended for: Everyone born from [birth year] through 1967. Anyone with known risk factors for hepatitis C. Sexually transmitted infections (STIs) You should be screened each year for STIs, including gonorrhea and chlamydia, if: You are sexually active and are younger than 67 years of age. You are older than 67 years of age and your health care provider tells you that you are at risk for this type of infection. Your sexual activity has changed since you were last screened, and you are at increased risk for chlamydia or gonorrhea. Ask your health care provider if you are at risk. Ask your health care provider about whether you are at high risk for HIV. Your health care provider may recommend a prescription medicine to help prevent HIV infection. If you choose to take medicine to prevent HIV, you should first get tested for HIV. You should then be tested every 3 months for as long as you are taking the medicine. Follow these instructions at home: Lifestyle Do not use any products that contain nicotine or tobacco, such as cigarettes, e-cigarettes, and chewing tobacco. If you need help quitting, ask your health care provider. Do not use street drugs. Do not share needles. Ask your health care provider for help if you need support or information about quitting drugs. Alcohol use Do not drink alcohol if your health care provider tells you not to drink. If you drink alcohol: Limit how much you have to 0-2 drinks a day. Be aware of how much alcohol is in your drink. In the [place] one drink equals one 12 oz bottle of beer (355 mL), one 5 oz glass of wine (148  mL), or one 1 oz glass of hard liquor (44 mL). General instructions Schedule regular health, dental, and eye exams. Stay current with your vaccines. Tell your health care provider if: You often feel depressed. You have ever been abused or do not feel safe at home. Summary Adopting a  healthy lifestyle and getting preventive care are important in promoting health and wellness. Follow your health care provider's instructions about healthy diet, exercising, and getting tested or screened for diseases. Follow your health care provider's instructions on monitoring your cholesterol and blood pressure. This information is not intended to replace advice given to you by your health care provider. Make sure you discuss any questions you have with your healthcare provider. Document Revised: 10/16/2018 Document Reviewed: 10/16/2018 Elsevier Patient Education  2022 [place]. Kishia Shackett M.D.

## 2021-06-19 NOTE — Patient Instructions (Addendum)
Good to see  you today . Continue lifestyle intervention healthy eating and exercise .  Will touch base with cardiology again  about   appropriate follow up.   Stay on asa for now.   Try  inhaler before mowing . If breathing an issue  getting worse get back with [place] earlier.   Health Maintenance, Male Adopting a healthy lifestyle and getting preventive care are important in promoting health and wellness. Ask your health care provider about: The right schedule for you to have regular tests and exams. Things you can do on your own to prevent diseases and keep yourself healthy. What should I know about diet, weight, and exercise? Eat a healthy diet  Eat a diet that includes plenty of vegetables, fruits, low-fat dairy products, and lean protein. Do not eat a lot of foods that are high in solid fats, added sugars, or sodium.  Maintain a healthy weight Body mass index (BMI) is a measurement that can be used to identify possible weight problems. It estimates body fat based on height and weight. Your health care provider can help determine your BMI and help you achieve or maintain ahealthy weight. Get regular exercise Get regular exercise. This is one of the most important things you can do for your health. Most adults should: Exercise for at least 150 minutes each week. The exercise should increase your heart rate and make you sweat (moderate-intensity exercise). Do strengthening exercises at least twice a week. This is in addition to the moderate-intensity exercise. Spend less time sitting. Even light physical activity can be beneficial. Watch cholesterol and blood lipids Have your blood tested for lipids and cholesterol at 67 years of age, then havethis test every 5 years. You may need to have your cholesterol levels checked more often if: Your lipid or cholesterol levels are high. You are older than 67 years of age. You are at high risk for heart disease. What should I know about cancer  screening? Many types of cancers can be detected early and may often be prevented. Depending on your health history and family history, you may need to have cancer screening at various ages. This may include screening for: Colorectal cancer. Prostate cancer. Skin cancer. Lung cancer. What should I know about heart disease, diabetes, and high blood pressure? Blood pressure and heart disease High blood pressure causes heart disease and increases the risk of stroke. This is more likely to develop in people who have high blood pressure readings, are of African descent, or are overweight. Talk with your health care provider about your target blood pressure readings. Have your blood pressure checked: Every 3-5 years if you are 56-34 years of age. Every year if you are 48 years old or older. If you are between the ages of 33 and 44 and are a current or former smoker, ask your health care provider if you should have a one-time screening for abdominal aortic aneurysm (AAA). Diabetes Have regular diabetes screenings. This checks your fasting blood sugar level. Have the screening done: Once every three years after age 51 if you are at a normal weight and have a low risk for diabetes. More often and at a younger age if you are overweight or have a high risk for diabetes. What should I know about preventing infection? Hepatitis B If you have a higher risk for hepatitis B, you should be screened for this virus. Talk with your health care provider to find out if you are at risk forhepatitis B infection.  Hepatitis C Blood testing is recommended for: Everyone born from [birth year] through 1967. Anyone with known risk factors for hepatitis C. Sexually transmitted infections (STIs) You should be screened each year for STIs, including gonorrhea and chlamydia, if: You are sexually active and are younger than 67 years of age. You are older than 67 years of age and your health care provider tells you that you are at  risk for this type of infection. Your sexual activity has changed since you were last screened, and you are at increased risk for chlamydia or gonorrhea. Ask your health care provider if you are at risk. Ask your health care provider about whether you are at high risk for HIV. Your health care provider may recommend a prescription medicine to help prevent HIV infection. If you choose to take medicine to prevent HIV, you should first get tested for HIV. You should then be tested every 3 months for as long as you are taking the medicine. Follow these instructions at home: Lifestyle Do not use any products that contain nicotine or tobacco, such as cigarettes, e-cigarettes, and chewing tobacco. If you need help quitting, ask your health care provider. Do not use street drugs. Do not share needles. Ask your health care provider for help if you need support or information about quitting drugs. Alcohol use Do not drink alcohol if your health care provider tells you not to drink. If you drink alcohol: Limit how much you have to 0-2 drinks a day. Be aware of how much alcohol is in your drink. In the [place] one drink equals one 12 oz bottle of beer (355 mL), one 5 oz glass of wine (148 mL), or one 1 oz glass of hard liquor (44 mL). General instructions Schedule regular health, dental, and eye exams. Stay current with your vaccines. Tell your health care provider if: You often feel depressed. You have ever been abused or do not feel safe at home. Summary Adopting a healthy lifestyle and getting preventive care are important in promoting health and wellness. Follow your health care provider's instructions about healthy diet, exercising, and getting tested or screened for diseases. Follow your health care provider's instructions on monitoring your cholesterol and blood pressure. This information is not intended to replace advice given to you by your health care provider. Make sure you discuss any  questions you have with your healthcare provider. Document Revised: 10/16/2018 Document Reviewed: 10/16/2018 Elsevier Patient Education  2022 [employer].

## 2021-06-20 ENCOUNTER — Other Ambulatory Visit: Payer: Self-pay

## 2021-06-20 ENCOUNTER — Ambulatory Visit (INDEPENDENT_AMBULATORY_CARE_PROVIDER_SITE_OTHER): Payer: Medicare HMO | Admitting: Internal Medicine

## 2021-06-20 ENCOUNTER — Encounter: Payer: Self-pay | Admitting: Internal Medicine

## 2021-06-20 VITALS — BP 140/70 | HR 54 | Temp 98.6°F | Ht 75.0 in | Wt 190.2 lb

## 2021-06-20 DIAGNOSIS — Z79899 Other long term (current) drug therapy: Secondary | ICD-10-CM | POA: Diagnosis not present

## 2021-06-20 DIAGNOSIS — Z8679 Personal history of other diseases of the circulatory system: Secondary | ICD-10-CM

## 2021-06-20 DIAGNOSIS — I10 Essential (primary) hypertension: Secondary | ICD-10-CM | POA: Diagnosis not present

## 2021-06-20 DIAGNOSIS — E786 Lipoprotein deficiency: Secondary | ICD-10-CM

## 2021-06-20 DIAGNOSIS — E039 Hypothyroidism, unspecified: Secondary | ICD-10-CM

## 2021-06-20 DIAGNOSIS — Z0000 Encounter for general adult medical examination without abnormal findings: Secondary | ICD-10-CM | POA: Diagnosis not present

## 2021-06-20 DIAGNOSIS — R739 Hyperglycemia, unspecified: Secondary | ICD-10-CM

## 2021-06-20 LAB — BASIC METABOLIC PANEL
BUN: 12 mg/dL (ref 6–23)
CO2: 29 mEq/L (ref 19–32)
Calcium: 9.2 mg/dL (ref 8.4–10.5)
Chloride: 103 mEq/L (ref 96–112)
Creatinine, Ser: 1.02 mg/dL (ref 0.40–1.50)
GFR: 76.3 mL/min (ref >60.00)
Glucose, Bld: 103 mg/dL — ABNORMAL HIGH (ref 70–99)
Potassium: 3.7 mEq/L (ref 3.5–5.1)
Sodium: 140 mEq/L (ref 135–145)

## 2021-06-20 LAB — CBC WITH DIFFERENTIAL/PLATELET
Basophils Absolute: 0 10*3/uL (ref 0.0–0.1)
Basophils Relative: 0.8 % (ref 0.0–3.0)
Eosinophils Absolute: 0.1 10*3/uL (ref 0.0–0.7)
Eosinophils Relative: 2.2 % (ref 0.0–5.0)
HCT: 44.6 % (ref 39.0–52.0)
Hemoglobin: 14.6 g/dL (ref 13.0–17.0)
Lymphocytes Relative: 30.6 % (ref 12.0–46.0)
Lymphs Abs: 1.6 10*3/uL (ref 0.7–4.0)
MCHC: 32.7 g/dL (ref 30.0–36.0)
MCV: 87 fl (ref 78.0–100.0)
Monocytes Absolute: 0.5 10*3/uL (ref 0.1–1.0)
Monocytes Relative: 9.5 % (ref 3.0–12.0)
Neutro Abs: 3 10*3/uL (ref 1.4–7.7)
Neutrophils Relative %: 56.9 % (ref 43.0–77.0)
Platelets: 183 10*3/uL (ref 150.0–400.0)
RBC: 5.13 Mil/uL (ref 4.22–5.81)
RDW: 14.1 % (ref 11.5–15.5)
WBC: 5.3 10*3/uL (ref 4.0–10.5)

## 2021-06-20 LAB — HEPATIC FUNCTION PANEL
ALT: 15 U/L (ref 0–53)
AST: 19 U/L (ref 0–37)
Albumin: 4 g/dL (ref 3.5–5.2)
Alkaline Phosphatase: 82 U/L (ref 39–117)
Bilirubin, Direct: 0.1 mg/dL (ref 0.0–0.3)
Total Bilirubin: 0.5 mg/dL (ref 0.2–1.2)
Total Protein: 8.1 g/dL (ref 6.0–8.3)

## 2021-06-20 LAB — LIPID PANEL
Cholesterol: 168 mg/dL (ref 0–200)
HDL: 33.2 mg/dL — ABNORMAL LOW (ref >39.00)
LDL Cholesterol: 112 mg/dL — ABNORMAL HIGH (ref 0–99)
NonHDL: 134.5
Total CHOL/HDL Ratio: 5
Triglycerides: 112 mg/dL (ref 0.0–149.0)
VLDL: 22.4 mg/dL (ref 0.0–40.0)

## 2021-06-20 LAB — TSH: TSH: 0.54 u[IU]/mL (ref 0.35–5.50)

## 2021-06-20 LAB — HEMOGLOBIN A1C: Hgb A1c MFr Bld: 6 % (ref 4.6–6.5)

## 2021-06-22 NOTE — Progress Notes (Signed)
Thyroid is in range continue same dosing Other labs are normal except cholesterol still could be better. Cardiology should reach out to you about a follow-up visit.

## 2021-07-10 ENCOUNTER — Other Ambulatory Visit: Payer: Self-pay | Admitting: Internal Medicine

## 2021-07-11 ENCOUNTER — Other Ambulatory Visit: Payer: Self-pay | Admitting: Internal Medicine

## 2021-09-01 ENCOUNTER — Telehealth: Payer: Self-pay | Admitting: Internal Medicine

## 2021-09-01 NOTE — Telephone Encounter (Signed)
Left message for patient to call back and schedule Medicare Annual Wellness Visit (AWV) either virtually or in office. Left  my James Nunez number [phone]   Last AWV 09/06/20 please schedule at anytime with [facility] Nurse Health Advisor 1 or 2   This should be a 45 minute visit.

## 2021-09-01 NOTE — Telephone Encounter (Signed)
Opened error

## 2021-09-01 NOTE — Telephone Encounter (Signed)
Disregard  Documented in error  /rbh 09/01/21

## 2021-09-16 ENCOUNTER — Telehealth: Payer: Self-pay | Admitting: Internal Medicine

## 2021-09-16 NOTE — Telephone Encounter (Signed)
Patient called because he is in [place] and has not brought his levothyroxine (SYNTHROID) 100 MCG tablet with him. He will be unable to take it for the next three days, patient did take it yesterday. Patient wants to know if he will be okay until he can take it again Monday.    Good callback number is [phone]     Please advise

## 2021-09-16 NOTE — Telephone Encounter (Signed)
Pt informed of the message and verbalized understanding  

## 2021-11-10 ENCOUNTER — Ambulatory Visit (INDEPENDENT_AMBULATORY_CARE_PROVIDER_SITE_OTHER): Payer: Medicare HMO

## 2021-11-10 DIAGNOSIS — Z23 Encounter for immunization: Secondary | ICD-10-CM | POA: Diagnosis not present

## 2022-06-21 ENCOUNTER — Ambulatory Visit (INDEPENDENT_AMBULATORY_CARE_PROVIDER_SITE_OTHER): Payer: Medicare HMO | Admitting: Internal Medicine

## 2022-06-21 ENCOUNTER — Encounter: Payer: Self-pay | Admitting: Internal Medicine

## 2022-06-21 VITALS — BP 148/92 | HR 52 | Temp 98.1°F | Ht 72.75 in | Wt 181.6 lb

## 2022-06-21 DIAGNOSIS — J45909 Unspecified asthma, uncomplicated: Secondary | ICD-10-CM

## 2022-06-21 DIAGNOSIS — R350 Frequency of micturition: Secondary | ICD-10-CM

## 2022-06-21 DIAGNOSIS — I10 Essential (primary) hypertension: Secondary | ICD-10-CM | POA: Diagnosis not present

## 2022-06-21 DIAGNOSIS — Z8679 Personal history of other diseases of the circulatory system: Secondary | ICD-10-CM | POA: Diagnosis not present

## 2022-06-21 DIAGNOSIS — Z1211 Encounter for screening for malignant neoplasm of colon: Secondary | ICD-10-CM | POA: Diagnosis not present

## 2022-06-21 DIAGNOSIS — R634 Abnormal weight loss: Secondary | ICD-10-CM | POA: Diagnosis not present

## 2022-06-21 DIAGNOSIS — R002 Palpitations: Secondary | ICD-10-CM | POA: Diagnosis not present

## 2022-06-21 DIAGNOSIS — E786 Lipoprotein deficiency: Secondary | ICD-10-CM | POA: Diagnosis not present

## 2022-06-21 DIAGNOSIS — R7301 Impaired fasting glucose: Secondary | ICD-10-CM

## 2022-06-21 DIAGNOSIS — Z79899 Other long term (current) drug therapy: Secondary | ICD-10-CM | POA: Diagnosis not present

## 2022-06-21 DIAGNOSIS — E039 Hypothyroidism, unspecified: Secondary | ICD-10-CM

## 2022-06-21 DIAGNOSIS — Z0000 Encounter for general adult medical examination without abnormal findings: Secondary | ICD-10-CM | POA: Diagnosis not present

## 2022-06-21 LAB — CBC WITH DIFFERENTIAL/PLATELET
Basophils Absolute: 0 10*3/uL (ref 0.0–0.1)
Basophils Relative: 0.5 % (ref 0.0–3.0)
Eosinophils Absolute: 0.1 10*3/uL (ref 0.0–0.7)
Eosinophils Relative: 1.4 % (ref 0.0–5.0)
HCT: 44.4 % (ref 39.0–52.0)
Hemoglobin: 14.4 g/dL (ref 13.0–17.0)
Lymphocytes Relative: 30.3 % (ref 12.0–46.0)
Lymphs Abs: 1.7 10*3/uL (ref 0.7–4.0)
MCHC: 32.3 g/dL (ref 30.0–36.0)
MCV: 88 fl (ref 78.0–100.0)
Monocytes Absolute: 0.6 10*3/uL (ref 0.1–1.0)
Monocytes Relative: 10.7 % (ref 3.0–12.0)
Neutro Abs: 3.3 10*3/uL (ref 1.4–7.7)
Neutrophils Relative %: 57.1 % (ref 43.0–77.0)
Platelets: 179 10*3/uL (ref 150.0–400.0)
RBC: 5.05 Mil/uL (ref 4.22–5.81)
RDW: 14.3 % (ref 11.5–15.5)
WBC: 5.7 10*3/uL (ref 4.0–10.5)

## 2022-06-21 LAB — HEPATIC FUNCTION PANEL
ALT: 16 U/L (ref 0–53)
AST: 18 U/L (ref 0–37)
Albumin: 4 g/dL (ref 3.5–5.2)
Alkaline Phosphatase: 81 U/L (ref 39–117)
Bilirubin, Direct: 0.1 mg/dL (ref 0.0–0.3)
Total Bilirubin: 0.5 mg/dL (ref 0.2–1.2)
Total Protein: 8 g/dL (ref 6.0–8.3)

## 2022-06-21 LAB — BASIC METABOLIC PANEL
BUN: 12 mg/dL (ref 6–23)
CO2: 30 mEq/L (ref 19–32)
Calcium: 9.3 mg/dL (ref 8.4–10.5)
Chloride: 104 mEq/L (ref 96–112)
Creatinine, Ser: 0.97 mg/dL (ref 0.40–1.50)
GFR: 80.48 mL/min (ref >60.00)
Glucose, Bld: 96 mg/dL (ref 70–99)
Potassium: 4.1 mEq/L (ref 3.5–5.1)
Sodium: 141 mEq/L (ref 135–145)

## 2022-06-21 LAB — PSA: PSA: 0.68 ng/mL (ref 0.10–4.00)

## 2022-06-21 LAB — T4, FREE: Free T4: 1.21 ng/dL (ref 0.60–1.60)

## 2022-06-21 LAB — LIPID PANEL
Cholesterol: 158 mg/dL (ref 0–200)
HDL: 32.4 mg/dL — ABNORMAL LOW (ref >39.00)
LDL Cholesterol: 105 mg/dL — ABNORMAL HIGH (ref 0–99)
NonHDL: 125.9
Total CHOL/HDL Ratio: 5
Triglycerides: 104 mg/dL (ref 0.0–149.0)
VLDL: 20.8 mg/dL (ref 0.0–40.0)

## 2022-06-21 LAB — TSH: TSH: 0.35 u[IU]/mL (ref 0.35–5.50)

## 2022-06-21 LAB — HEMOGLOBIN A1C: Hgb A1c MFr Bld: 6.2 % (ref 4.6–6.5)

## 2022-06-21 MED ORDER — VALSARTAN 160 MG PO TABS: 160.0000 mg | ORAL_TABLET | Freq: Every day | ORAL | 3 refills | Status: DC

## 2022-06-21 NOTE — Progress Notes (Unsigned)
Chief Complaint  Patient presents with   Annual Exam    HPI: Patient  James Nunez  66 y.o. comes in today for Preventive Health Care visit  and med evaluation  Asthma BP at home   average 130/70  ocass reasing.  Marland Kitchen Hx of af  Low hdl   Health Maintenance  Topic Date Due   Zoster Vaccines- Shingrix (1 of 2) Never done   COLONOSCOPY (Pts 45-70yr Insurance coverage will need to be confirmed)  11/06/2018   TETANUS/TDAP  11/06/2018   COVID-19 Vaccine (2 - Janssen risk series) 03/13/2020   Pneumonia Vaccine 68 Years old (3 - PPSV23 or PCV20) 11/09/2020   INFLUENZA VACCINE  02/05/2023 (Originally 06/06/2022)   Hepatitis C Screening  Completed   HPV VACCINES  Aged Out   Health Maintenance Review LIFESTYLE:  Exercise:   walk 150 per  week.  Tobacco/ETS: n Alcohol: n Sugar beverages: koolaid  12 oz.  Sleep:  about 6 .  Drug use: no HH of  2 no pets  Work:none for  2.5 years   Facial psoriasus and  psoriasis    otc helped .     ROS:  GEN/ HEENT: No fever, significant weight changes sweats headaches vision problems hearing changes, CV/ PULM; No chest pain shortness of breath cough, syncope,edema  change in exercise tolerance. GI /GU: No adominal pain, vomiting, change in bowel habits. No blood in the stool. No significant GU symptoms. SKIN/HEME: ,no acute skin rashes suspicious lesions or bleeding. No lymphadenopathy, nodules, masses.  NEURO/ PSYCH:  No neurologic signs such as weakness numbness. No depression anxiety. IMM/ Allergy: No unusual infections.  Allergy .   REST of 12 system review negative except as per HPI   Past Medical History:  Diagnosis Date   Allergic rhinitis    Asthma    ?   Atrial fibrillation, persistent (HCC)    Paroxysmal   Cardiomyopathy    related to hyperthyroid condition   Hypertension    Hypothyroidism    s/p hyperthyroid heart failure and RAI rx   Low HDL (under 40)    Palpitations 78/45/3646  Umbilical hernia    surgery consult  no intervention if no sx     Past Surgical History:  Procedure Laterality Date   ABDOMINAL SURGERY     no surgical history      Family History  Problem Relation Age of Onset   Stroke Mother    Diabetes Mother    Stomach cancer Father    Diabetes Other    Asthma Other        sibs   Alcohol abuse Other        sib    Social History   Socioeconomic History   Marital status: Married    Spouse name: Not on file   Number of children: 2   Years of education: Not on file   Highest education level: Not on file  Occupational History   Occupation: Truck dEducation administrator [employer]     Comment: [employer] Tobacco Use   Smoking status: Never   Smokeless tobacco: Never  Vaping Use   Vaping Use: Never used  Substance and Sexual Activity   Alcohol use: No   Drug use: No   Sexual activity: Not on file  Other Topics Concern   Not on file  Social History Narrative   Married, Ex athlete.  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 Long Hours. 50 hours per week.   Does physical loading.  [place]    Social Determinants of Radio broadcast assistant Strain: Not on file  Food Insecurity: Not on file  Transportation Needs: Not on file  Physical Activity: Not on file  Stress: Not on file  Social Connections: Not on file    Outpatient Medications Prior to Visit  Medication Sig Dispense Refill   albuterol (VENTOLIN HFA) 108 (90 Base) MCG/ACT inhaler INHALE 1 TO 2 PUFFS BY MOUTH EVERY 6 HOURS AS NEEDED FOR ASTHMA 18 g 3   ASPIRIN LOW DOSE PO Take 81 mg by mouth.     levothyroxine (SYNTHROID) 100 MCG tablet TAKE 1 TABLET BY MOUTH EVERY DAY 90 tablet 3   losartan (COZAAR) 25 MG tablet TAKE 2 TABLETS BY MOUTH EVERY DAY 30 tablet 2   rivaroxaban (XARELTO) 20 MG TABS tablet Take 1 tablet (20 mg total) by mouth daily with supper. 30 tablet 3   valsartan (DIOVAN) 80 MG tablet TAKE 1 TABLET BY MOUTH  EVERY DAY 90 tablet 3   verapamil (CALAN-SR) 120 MG CR tablet TAKE 1/2 TABLET BY MOUTH AT BEDTIME TAKE WITH 240 MG TABLET 45 tablet 3   verapamil (CALAN-SR) 240 MG CR tablet TAKE 1 TABLET (240 MG TOTAL) BY MOUTH AT BEDTIME. TAKE WITH '60MG'$  TAB. 90 tablet 3   No facility-administered medications prior to visit.     EXAM:  BP (!) 150/88 (BP Location: Right Arm, Patient Position: Sitting, Cuff Size: Normal)   Pulse (!) 52   Temp 98.1 F (36.7 C) (Oral)   Ht 6' 0.75" (1.848 m)   Wt 181 lb 9.6 oz (82.4 kg)   SpO2 98%   BMI 24.12 kg/m   Body mass index is 24.12 kg/m. Wt Readings from Last 3 Encounters:  06/21/22 181 lb 9.6 oz (82.4 kg)  06/20/21 190 lb 3.2 oz (86.3 kg)  11/24/19 195 lb (88.5 kg)    Physical Exam: Vital signs reviewed PYP:PJKD is a well-developed well-nourished alert cooperative    who appearsr stated age in no acute distress.  HEENT: normocephalic atraumatic , Eyes: PERRL EOM's full, conjunctiva clear, Nares: paten,t no deformity discharge or tenderness., Ears: no deformity EAC's clear TMs with normal landmarks. Mouth: clear OP, no lesions, edema.  Moist mucous membranes. Dentition in adequate repair. NECK: supple without masses, thyromegaly or bruits. CHEST/PULM:  Clear to auscultation and percussion breath sounds equal no wheeze , rales or rhonchi. No chest wall deformities or tenderness. Breast: normal by inspection . No dimpling, discharge, masses, tenderness or discharge . CV: PMI is nondisplaced, S1 S2 no gallops, murmurs, rubs. Peripheral pulses are full without delay.No JVD .  ABDOMEN: Bowel sounds normal nontender  No guard or rebound, no hepato splenomegal no CVA tenderness.  No hernia. Extremtities:  No clubbing cyanosis or edema, no acute joint swelling or redness no focal atrophy NEURO:  Oriented x3, cranial nerves 3-12 appear to be intact, no obvious focal weakness,gait within normal limits no abnormal reflexes or asymmetrical SKIN: No acute rashes  normal turgor, color, no bruising or petechiae. PSYCH: Oriented, good eye contact, no obvious depression anxiety, cognition and judgment appear normal. LN: no cervical axillary inguinal adenopathy  Lab Results  Component Value Date   WBC 5.3 06/20/2021   HGB 14.6 06/20/2021   HCT 44.6 06/20/2021   PLT 183.0 06/20/2021   GLUCOSE 103 (H) 06/20/2021  CHOL 168 06/20/2021   TRIG 112.0 06/20/2021   HDL 33.20 (L) 06/20/2021   LDLCALC 112 (H) 06/20/2021   ALT 15 06/20/2021   AST 19 06/20/2021   NA 140 06/20/2021   K 3.7 06/20/2021   CL 103 06/20/2021   CREATININE 1.02 06/20/2021   BUN 12 06/20/2021   CO2 29 06/20/2021   TSH 0.54 06/20/2021   PSA 0.57 11/10/2019   HGBA1C 6.0 06/20/2021    BP Readings from Last 3 Encounters:  06/21/22 (!) 150/88  06/20/21 140/70  11/24/19 (!) 152/78    Lab plan reviewed with patient   ASSESSMENT AND PLAN:  Discussed the following assessment and plan:    ICD-10-CM   1. Hypothyroidism, unspecified type  E03.9     2. Low HDL (under 40)  E78.6     3. Medication management  Z79.899     4. Fasting hyperglycemia  R73.01     5. Uncomplicated asthma, unspecified asthma severity, unspecified whether persistent  J45.909     6. PAF (paroxysmal atrial fibrillation) (HCC)  I48.0      No follow-ups on file.  Patient Care Team: Burnis Medin, MD as PCP - General O'Neal, Cassie Freer, MD as PCP - Cardiology (Cardiology) Leonie Man, MD as Consulting Physician (Cardiology) There are no Patient Instructions on file for this visit.  Standley Brooking. Tressie Ragin M.D.

## 2022-06-21 NOTE — Patient Instructions (Addendum)
Good to see you today . Will order cologuard  ( can also do insurance screen)   Bp goal 130/80 and below    Will increase ose of the valsartan to 160 mg per day . And stay on the verapamil.  Send in readings   bp after a months on higher dose   Plan cardiology opinion about  palpitations that could be  just premature beats  but to assess risk of a fib and opinoin about  changing asa to anticoagulant.   If all ok may want ot add resistance training   at home to your exercise regimen If weight loss continues we will assess for more evaluation  Can get flu shot in fall. Shingrix   at pharmacy when wish.  Can do prevnar 20 next year .

## 2022-06-22 ENCOUNTER — Ambulatory Visit (INDEPENDENT_AMBULATORY_CARE_PROVIDER_SITE_OTHER): Payer: Medicare HMO

## 2022-06-22 DIAGNOSIS — Z8679 Personal history of other diseases of the circulatory system: Secondary | ICD-10-CM

## 2022-06-22 DIAGNOSIS — R002 Palpitations: Secondary | ICD-10-CM

## 2022-06-22 NOTE — Progress Notes (Unsigned)
ZIO XT mailed to pt's home address. 

## 2022-06-26 DIAGNOSIS — Z8679 Personal history of other diseases of the circulatory system: Secondary | ICD-10-CM

## 2022-06-26 DIAGNOSIS — R002 Palpitations: Secondary | ICD-10-CM

## 2022-06-28 NOTE — Progress Notes (Signed)
Blood sugar is stable in the prediabetic range kidney function normal Thyroid is in range but on the side of higher dosing. Because of your concern about loss of muscle mass would like to try decreasing your thyroid dose slightly take levothyroxine same dose 6 days a week,  off Sunday please update the med list Recheck TSH in 3 months on this new dose

## 2022-06-29 ENCOUNTER — Other Ambulatory Visit: Payer: Self-pay

## 2022-06-29 DIAGNOSIS — E039 Hypothyroidism, unspecified: Secondary | ICD-10-CM

## 2022-07-06 ENCOUNTER — Other Ambulatory Visit: Payer: Self-pay | Admitting: Internal Medicine

## 2022-07-08 LAB — COLOGUARD

## 2022-07-17 ENCOUNTER — Ambulatory Visit (INDEPENDENT_AMBULATORY_CARE_PROVIDER_SITE_OTHER): Payer: Medicare HMO

## 2022-07-17 VITALS — Ht 72.75 in | Wt 181.0 lb

## 2022-07-17 DIAGNOSIS — Z0000 Encounter for general adult medical examination without abnormal findings: Secondary | ICD-10-CM

## 2022-07-17 NOTE — Progress Notes (Signed)
Subjective:   James Nunez is a 66 y.o. male who presents for Medicare Annual/Subsequent preventive examination.  Review of Systems    Virtual Visit via Telephone Note  I connected with  James Nunez on 07/17/22 at  8:15 AM EDT by telephone and verified that I am speaking with the correct person using two identifiers.  Location: Patient: Home Provider: Office Persons participating in the virtual visit: patient/Nurse Health Advisor   I discussed the limitations, risks, security and privacy concerns of performing an evaluation and management service by telephone and the availability of in person appointments. The patient expressed understanding and agreed to proceed.  Interactive audio and video telecommunications were attempted between this nurse and patient, however failed, due to patient having technical difficulties OR patient did not have access to video capability.  We continued and completed visit with audio only.  Some vital signs may be absent or patient reported.   Criselda Peaches, LPN  Cardiac Risk Factors include: advanced age (>42mn, >>26women);hypertension;male gender     Objective:    Today's Vitals   07/17/22 0819  Weight: 181 lb (82.1 kg)  Height: 6' 0.75" (1.848 m)   Body mass index is 24.04 kg/m.     07/17/2022    8:26 AM  Advanced Directives  Does Patient Have a Medical Advance Directive? No  Would patient like information on creating a medical advance directive? No - Patient declined    Current Medications (verified) Outpatient Encounter Medications as of 07/17/2022  Medication Sig   albuterol (VENTOLIN HFA) 108 (90 Base) MCG/ACT inhaler INHALE 1 TO 2 PUFFS BY MOUTH EVERY 6 HOURS AS NEEDED FOR ASTHMA   ASPIRIN LOW DOSE PO Take 81 mg by mouth.   levothyroxine (SYNTHROID) 100 MCG tablet TAKE 1 TABLET BY MOUTH EVERY DAY   valsartan (DIOVAN) 160 MG tablet Take 1 tablet (160 mg total) by mouth daily. Dosage change   verapamil (CALAN-SR) 120 MG  CR tablet TAKE 1/2 TABLET BY MOUTH AT BEDTIME TAKE WITH 240 MG TABLET   verapamil (CALAN-SR) 240 MG CR tablet TAKE 1 TABLET (240 MG TOTAL) BY MOUTH AT BEDTIME. TAKE WITH '60MG'$  TAB.   No facility-administered encounter medications on file as of 07/17/2022.    Allergies (verified) Simvastatin and Dyazide [hydrochlorothiazide w-triamterene]   History: Past Medical History:  Diagnosis Date   Allergic rhinitis    Asthma    ?   Atrial fibrillation, persistent (HCC)    Paroxysmal   Cardiomyopathy    related to hyperthyroid condition   Hypertension    Hypothyroidism    s/p hyperthyroid heart failure and RAI rx   Low HDL (under 40)    Palpitations 71/94/1740  Umbilical hernia    surgery consult no intervention if no sx    Past Surgical History:  Procedure Laterality Date   ABDOMINAL SURGERY     no surgical history     Family History  Problem Relation Age of Onset   Stroke Mother    Diabetes Mother    Stomach cancer Father    Diabetes Other    Asthma Other        sibs   Alcohol abuse Other        sib   Social History   Socioeconomic History   Marital status: Married    Spouse name: Not on file   Number of children: 2   Years of education: Not on file   Highest education level: Not on file  Occupational History   Occupation: Truck Education administrator: [employer]  Tobacco Use   Smoking status: Never   Smokeless tobacco: Never  Vaping Use   Vaping Use: Never used  Substance and Sexual Activity   Alcohol use: No   Drug use: No   Sexual activity: Not on file  Other Topics Concern   Not on file  Social History Narrative   Married, 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 Long Hours. 50 hours per week.   Does physical loading.  [place]    Social Determinants of Health   Financial Resource Strain: Low Risk  (07/17/2022)   Overall Financial  Resource Strain (CARDIA)    Difficulty of Paying Living Expenses: Not hard at all  Food Insecurity: No Food Insecurity (07/17/2022)   Hunger Vital Sign    Worried About Running Out of Food in the Last Year: Never true    Ran Out of Food in the Last Year: Never true  Transportation Needs: No Transportation Needs (07/17/2022)   PRAPARE - Hydrologist (Medical): No    Lack of Transportation (Non-Medical): No  Physical Activity: Sufficiently Active (07/17/2022)   Exercise Vital Sign    Days of Exercise per Week: 5 days    Minutes of Exercise per Session: 30 min  Stress: No Stress Concern Present (07/17/2022)   [place]    Feeling of Stress : Not at all  Social Connections: Moderately Isolated (07/17/2022)   Social Connection and Isolation Panel [NHANES]    Frequency of Communication with Friends and Family: More than three times a week    Frequency of Social Gatherings with Friends and Family: More than three times a week    Attends Religious Services: Never    Marine scientist or Organizations: No    Attends Music therapist: Never    Marital Status: Married    Tobacco Counseling Counseling given: Not Answered   Clinical Intake:  Pre-visit preparation completed: No  Pain : No/denies pain     BMI - recorded: 24.12 Nutritional Status: BMI of 19-24  Normal Nutritional Risks: None Diabetes: No  How often do you need to have someone help you when you read instructions, pamphlets, or other written materials from your doctor or pharmacy?: 1 - Never  Diabetic?  No  Interpreter Needed?: No  Information entered by :: Vinson Moselle LPN   Activities of Daily Living    07/17/2022    8:25 AM  In your present state of health, do you have any difficulty performing the following activities:  Hearing? 0  Vision? 0  Difficulty concentrating or making decisions? 0   Walking or climbing stairs? 0  Dressing or bathing? 0  Doing errands, shopping? 0  Preparing Food and eating ? N  Using the Toilet? N  In the past six months, have you accidently leaked urine? N  Do you have problems with loss of bowel control? N  Managing your Medications? N  Managing your Finances? N  Housekeeping or managing your Housekeeping? N    Patient Care Team: Burnis Medin, MD as PCP - General O'Neal, Cassie Freer, MD as PCP - Cardiology (Cardiology) Leonie Man, MD as Consulting Physician (Cardiology)  Indicate any recent Medical Services  you may have received from other than [facility] providers in the past year (date may be approximate).     Assessment:   This is a routine wellness examination for [place].  Hearing/Vision screen Hearing Screening - Comments:: Denies hearing difficulties   Vision Screening - Comments:: Wears rx glasses - up to date with routine eye exams with [facility]  Dietary issues and exercise activities discussed: Current Exercise Habits: Home exercise routine, Type of exercise: strength training/weights, Time (Minutes): 30, Frequency (Times/Week): 5, Weekly Exercise (Minutes/Week): 150, Intensity: Moderate, Exercise limited by: None identified   Goals Addressed               This Visit's Progress     No current goal (pt-stated)         Depression Screen    07/17/2022    8:24 AM 06/21/2022   10:09 AM 06/20/2021   10:17 AM 11/10/2019    3:49 PM 05/20/2018    5:45 PM  PHQ 2/9 Scores  PHQ - 2 Score 0 0 0 0 0  PHQ- 9 Score 0 0 0      Fall Risk    07/17/2022    8:26 AM 06/21/2022   10:09 AM 06/20/2021    9:24 AM 11/10/2019    3:48 PM  Fall Risk   Falls in the past year? 0 0 0 0  Number falls in past yr: 0 0 0 0  Injury with Fall? 0 0 0 0  Risk for fall due to : No Fall Risks No Fall Risks    Follow up Falls prevention discussed Falls evaluation completed  Falls evaluation completed    [place]:  Any stairs in or around the home? Yes  If so, are there any without handrails? No  Home free of loose throw rugs in walkways, pet beds, electrical cords, etc? Yes  Adequate lighting in your home to reduce risk of falls? Yes   ASSISTIVE DEVICES UTILIZED TO PREVENT FALLS:  Life alert? No  Use of a cane, walker or w/c? No  Grab bars in the bathroom? No  Shower chair or bench in shower? No  Elevated toilet seat or a handicapped toilet? No   TIMED UP AND GO:  Was the test performed? No . Audio Visit   Cognitive Function:        07/17/2022    8:27 AM  6CIT Screen  What Year? 0 points  What month? 0 points  What time? 0 points  Count back from 20 0 points  Months in reverse 0 points  Repeat phrase 0 points  Total Score 0 points    Immunizations Immunization History  Administered Date(s) Administered   Fluad Quad(high Dose 65+) 11/10/2021   Influenza Split 11/04/2012   Influenza,inj,Quad PF,6+ Mos 12/08/2013, 08/28/2016   Janssen (J&J) SARS-COV-2 Vaccination 02/14/2020   Pneumococcal Conjugate-13 11/10/2019   Pneumococcal Polysaccharide-23 03/29/2010   Td 11/06/1996    TDAP status: Due, Education has been provided regarding the importance of this vaccine. Advised may receive this vaccine at local pharmacy or Health Dept. Aware to provide a copy of the vaccination record if obtained from local pharmacy or Health Dept. Verbalized acceptance and understanding.  Flu Vaccine status: Declined, Education has been provided regarding the importance of this vaccine but patient still declined. Advised may receive this vaccine at local pharmacy or Health Dept. Aware to provide a copy of the vaccination record if obtained from local pharmacy or Health Dept. Verbalized  acceptance and understanding.  Pneumococcal vaccine status: Due, Education has been provided regarding the importance of this vaccine. Advised may receive this vaccine at local pharmacy or Health Dept. Aware to  provide a copy of the vaccination record if obtained from local pharmacy or Health Dept. Verbalized acceptance and understanding.  Covid-19 vaccine status: Declined, Education has been provided regarding the importance of this vaccine but patient still declined. Advised may receive this vaccine at local pharmacy or Health Dept.or vaccine clinic. Aware to provide a copy of the vaccination record if obtained from local pharmacy or Health Dept. Verbalized acceptance and understanding.  Qualifies for Shingles Vaccine? Yes   Zostavax completed No   Shingrix Completed?: No.    Education has been provided regarding the importance of this vaccine. Patient has been advised to call insurance company to determine out of pocket expense if they have not yet received this vaccine. Advised may also receive vaccine at local pharmacy or Health Dept. Verbalized acceptance and understanding.  Screening Tests Health Maintenance  Topic Date Due   COVID-19 Vaccine (2 - Janssen risk series) 08/02/2022 (Originally 03/13/2020)   Zoster Vaccines- Shingrix (1 of 2) 10/16/2022 (Originally 06/05/1975)   INFLUENZA VACCINE  02/05/2023 (Originally 06/06/2022)   Pneumonia Vaccine 68+ Years old (38 - PPSV23 or PCV20) 07/18/2023 (Originally 11/09/2020)   COLONOSCOPY (Pts 45-54yr Insurance coverage will need to be confirmed)  07/18/2023 (Originally 11/06/2018)   TETANUS/TDAP  07/18/2023 (Originally 11/06/2018)   Hepatitis C Screening  Completed   HPV VACCINES  Aged Out    Health Maintenance  There are no preventive care reminders to display for this patient.   Colorectal cancer screening: Referral to GI placed [facility]. Pt aware the office will call re: appt.  Lung Cancer Screening: (Low Dose CT Chest recommended if Age 68-80years, 30 pack-year currently smoking OR have quit w/in 15years.) does not qualify.     Additional Screening:  Hepatitis C Screening: does qualify; Completed 05/20/18  Vision Screening: Recommended annual  ophthalmology exams for early detection of glaucoma and other disorders of the eye. Is the patient up to date with their annual eye exam?  Yes  Who is the provider or what is the name of the office in which the patient attends annual eye exams? [facility] If pt is not established with a provider, would they like to be referred to a provider to establish care? No .   Dental Screening: Recommended annual dental exams for proper oral hygiene  Community Resource Referral / Chronic Care Management:  CRR required this visit?  No   CCM required this visit?  No      Plan:     I have personally reviewed and noted the following in the patient's chart:   Medical and social history Use of alcohol, tobacco or illicit drugs  Current medications and supplements including opioid prescriptions. Patient is not currently taking opioid prescriptions. Functional ability and status Nutritional status Physical activity Advanced directives List of other physicians Hospitalizations, surgeries, and ER visits in previous 12 months Vitals Screenings to include cognitive, depression, and falls Referrals and appointments  In addition, I have reviewed and discussed with patient certain preventive protocols, quality metrics, and best practice recommendations. A written personalized care plan for preventive services as well as general preventive health recommendations were provided to patient.     BCriselda Peaches LPN   [id number]/90/3009  Nurse Notes: None

## 2022-07-17 NOTE — Patient Instructions (Addendum)
James Nunez , Thank you for taking time to come for your Medicare Wellness Visit. I appreciate your ongoing commitment to your health goals. Please review the following plan we discussed and let me know if I can assist you in the future.   Screening recommendations/referrals: Colonoscopy: Deferred Recommended yearly ophthalmology/optometry visit for glaucoma screening and checkup Recommended yearly dental visit for hygiene and checkup  Vaccinations: Influenza vaccine: Deferred Pneumococcal vaccine: Deferred Tdap vaccine: Deferred Shingles vaccine: Deferred   Covid-19: Declined  Advanced directives: Advance directive discussed with you today. Even though you declined this today, please call our office should you change your mind, and we can give you the proper paperwork for you to fill out.   Conditions/risks identified: None  Next appointment: Follow up in one year for your annual wellness visit.    Preventive Care 39 Years and Older, Male  Preventive care refers to lifestyle choices and visits with your health care provider that can promote health and wellness. What does preventive care include? A yearly physical exam. This is also called an annual well check. Dental exams once or twice a year. Routine eye exams. Ask your health care provider how often you should have your eyes checked. Personal lifestyle choices, including: Daily care of your teeth and gums. Regular physical activity. Eating a healthy diet. Avoiding tobacco and drug use. Limiting alcohol use. Practicing safe sex. Taking low doses of aspirin every day. Taking vitamin and mineral supplements as recommended by your health care provider. What happens during an annual well check? The services and screenings done by your health care provider during your annual well check will depend on your age, overall health, lifestyle risk factors, and family history of disease. Counseling  Your health care provider may ask you  questions about your: Alcohol use. Tobacco use. Drug use. Emotional well-being. Home and relationship well-being. Sexual activity. Eating habits. History of falls. Memory and ability to understand (cognition). Work and work Statistician. Screening  You may have the following tests or measurements: Height, weight, and BMI. Blood pressure. Lipid and cholesterol levels. These may be checked every 5 years, or more frequently if you are over 40 years old. Skin check. Lung cancer screening. You may have this screening every year starting at age 74 if you have a 30-pack-year history of smoking and currently smoke or have quit within the past 15 years. Fecal occult blood test (FOBT) of the stool. You may have this test every year starting at age 70. Flexible sigmoidoscopy or colonoscopy. You may have a sigmoidoscopy every 5 years or a colonoscopy every 10 years starting at age 55. Prostate cancer screening. Recommendations will vary depending on your family history and other risks. Hepatitis C blood test. Hepatitis B blood test. Sexually transmitted disease (STD) testing. Diabetes screening. This is done by checking your blood sugar (glucose) after you have not eaten for a while (fasting). You may have this done every 1-3 years. Abdominal aortic aneurysm (AAA) screening. You may need this if you are a current or former smoker. Osteoporosis. You may be screened starting at age 62 if you are at high risk. Talk with your health care provider about your test results, treatment options, and if necessary, the need for more tests. Vaccines  Your health care provider may recommend certain vaccines, such as: Influenza vaccine. This is recommended every year. Tetanus, diphtheria, and acellular pertussis (Tdap, Td) vaccine. You may need a Td booster every 10 years. Zoster vaccine. You may need this after age  60. Pneumococcal 13-valent conjugate (PCV13) vaccine. One dose is recommended after age  70. Pneumococcal polysaccharide (PPSV23) vaccine. One dose is recommended after age 47. Talk to your health care provider about which screenings and vaccines you need and how often you need them. This information is not intended to replace advice given to you by your health care provider. Make sure you discuss any questions you have with your health care provider. Document Released: 11/19/2015 Document Revised: 07/12/2016 Document Reviewed: 08/24/2015 Elsevier Interactive Patient Education  2017 [place] Prevention in the Home Falls can cause injuries. They can happen to people of all ages. There are many things you can do to make your home safe and to help prevent falls. What can I do on the outside of my home? Regularly fix the edges of walkways and driveways and fix any cracks. Remove anything that might make you trip as you walk through a door, such as a raised step or threshold. Trim any bushes or trees on the path to your home. Use bright outdoor lighting. Clear any walking paths of anything that might make someone trip, such as rocks or tools. Regularly check to see if handrails are loose or broken. Make sure that both sides of any steps have handrails. Any raised decks and porches should have guardrails on the edges. Have any leaves, snow, or ice cleared regularly. Use sand or salt on walking paths during winter. Clean up any spills in your garage right away. This includes oil or grease spills. What can I do in the bathroom? Use night lights. Install grab bars by the toilet and in the tub and shower. Do not use towel bars as grab bars. Use non-skid mats or decals in the tub or shower. If you need to sit down in the shower, use a plastic, non-slip stool. Keep the floor dry. Clean up any water that spills on the floor as soon as it happens. Remove soap buildup in the tub or shower regularly. Attach bath mats securely with double-sided non-slip rug tape. Do not have throw  rugs and other things on the floor that can make you trip. What can I do in the bedroom? Use night lights. Make sure that you have a light by your bed that is easy to reach. Do not use any sheets or blankets that are too big for your bed. They should not hang down onto the floor. Have a firm chair that has side arms. You can use this for support while you get dressed. Do not have throw rugs and other things on the floor that can make you trip. What can I do in the kitchen? Clean up any spills right away. Avoid walking on wet floors. Keep items that you use a lot in easy-to-reach places. If you need to reach something above you, use a strong step stool that has a grab bar. Keep electrical cords out of the way. Do not use floor polish or wax that makes floors slippery. If you must use wax, use non-skid floor wax. Do not have throw rugs and other things on the floor that can make you trip. What can I do with my stairs? Do not leave any items on the stairs. Make sure that there are handrails on both sides of the stairs and use them. Fix handrails that are broken or loose. Make sure that handrails are as long as the stairways. Check any carpeting to make sure that it is firmly attached to the stairs. Fix  any carpet that is loose or worn. Avoid having throw rugs at the top or bottom of the stairs. If you do have throw rugs, attach them to the floor with carpet tape. Make sure that you have a light switch at the top of the stairs and the bottom of the stairs. If you do not have them, ask someone to add them for you. What else can I do to help prevent falls? Wear shoes that: Do not have high heels. Have rubber bottoms. Are comfortable and fit you well. Are closed at the toe. Do not wear sandals. If you use a stepladder: Make sure that it is fully opened. Do not climb a closed stepladder. Make sure that both sides of the stepladder are locked into place. Ask someone to hold it for you, if  possible. Clearly mark and make sure that you can see: Any grab bars or handrails. First and last steps. Where the edge of each step is. Use tools that help you move around (mobility aids) if they are needed. These include: Canes. Walkers. Scooters. Crutches. Turn on the lights when you go into a dark area. Replace any light bulbs as soon as they burn out. Set up your furniture so you have a clear path. Avoid moving your furniture around. If any of your floors are uneven, fix them. If there are any pets around you, be aware of where they are. Review your medicines with your doctor. Some medicines can make you feel dizzy. This can increase your chance of falling. Ask your doctor what other things that you can do to help prevent falls. This information is not intended to replace advice given to you by your health care provider. Make sure you discuss any questions you have with your health care provider. Document Released: 08/19/2009 Document Revised: 03/30/2016 Document Reviewed: 11/27/2014 Elsevier Interactive Patient Education  2017 [employer].

## 2022-07-19 ENCOUNTER — Telehealth: Payer: Self-pay | Admitting: Cardiovascular Disease

## 2022-07-19 DIAGNOSIS — I480 Paroxysmal atrial fibrillation: Secondary | ICD-10-CM

## 2022-07-19 DIAGNOSIS — Z8679 Personal history of other diseases of the circulatory system: Secondary | ICD-10-CM | POA: Diagnosis not present

## 2022-07-19 DIAGNOSIS — R002 Palpitations: Secondary | ICD-10-CM | POA: Diagnosis not present

## 2022-07-19 NOTE — Telephone Encounter (Signed)
Raquel Sarna from [facility] reported patient was in afib 18% of the time. Had rapid rate of 212 for 60 seconds and this was on strip 12 p. 15. Patient also had 47 runs of SVT. Patient stated he had 2 events where he felt rapid heart rate and feeling tired. He feels fine now. Dr. Audie Box advised. Order for echo and f/u with him. Patient is to keep appointment for tomorrow. Echo ordered. Patient informed and verbalized understanding.

## 2022-07-19 NOTE — Progress Notes (Signed)
Cardiology Office Note:   Date:  07/20/2022  NAME:  James Nunez    MRN: [id number] DOB:  1956/10/23   PCP:  Burnis Medin, MD  Cardiologist:  Evalina Field, MD  Electrophysiologist:  None   Referring MD: Burnis Medin, MD   Chief Complaint  Patient presents with   Follow-up        History of Present Illness:   James Nunez is a 66 y.o. male with a hx of pAF, HTN who presents for follow-up.   Recently wore a heart monitor.  Was having palpitations.  Has symptoms that occur once per month.  He did have 18% A-fib burden.  His EKG shows he is in sinus rhythm.  He does have a short PR interval but I see no delta wave.  He denies any chest pain.  No trouble breathing.  He is really without limitations today in office.  He can walk as far as he likes.  His A-fib does not appear to be bothering him.  We discussed getting on Eliquis as well as stopping his verapamil and starting diltiazem to see if this helps.  He is okay to do this.  We discussed rhythm medication versus ablation to control his A-fib.  He tells me his symptoms are infrequent he would like to forego this at this time.  He has an echocardiogram planned for Monday.  Recent thyroid study is normal.  Recent lab work is unremarkable.  Problem List 1. Paroxysmal Atrial fibrillation -CHADSVASC=2 (age, HTN) -18% burden  2. HTN  Past Medical History: Past Medical History:  Diagnosis Date   Allergic rhinitis    Asthma    ?   Atrial fibrillation, persistent (HCC)    Paroxysmal   Cardiomyopathy    related to hyperthyroid condition   Hypertension    Hypothyroidism    s/p hyperthyroid heart failure and RAI rx   Low HDL (under 40)    Palpitations 11/15/3157   Umbilical hernia    surgery consult no intervention if no sx     Past Surgical History: Past Surgical History:  Procedure Laterality Date   ABDOMINAL SURGERY     no surgical history      Current Medications: Current Meds  Medication Sig   albuterol  (VENTOLIN HFA) 108 (90 Base) MCG/ACT inhaler INHALE 1 TO 2 PUFFS BY MOUTH EVERY 6 HOURS AS NEEDED FOR ASTHMA   apixaban (ELIQUIS) 5 MG TABS tablet Take 1 tablet (5 mg total) by mouth 2 (two) times daily.   diltiazem (CARDIZEM CD) 240 MG 24 hr capsule Take 1 capsule (240 mg total) by mouth daily.   levothyroxine (SYNTHROID) 100 MCG tablet TAKE 1 TABLET BY MOUTH EVERY DAY   valsartan (DIOVAN) 160 MG tablet Take 1 tablet (160 mg total) by mouth daily. Dosage change   [DISCONTINUED] ASPIRIN LOW DOSE PO Take 81 mg by mouth.   [DISCONTINUED] verapamil (CALAN-SR) 120 MG CR tablet TAKE 1/2 TABLET BY MOUTH AT BEDTIME TAKE WITH 240 MG TABLET   [DISCONTINUED] verapamil (CALAN-SR) 240 MG CR tablet TAKE 1 TABLET (240 MG TOTAL) BY MOUTH AT BEDTIME. TAKE WITH 60MG TAB.     Allergies:    Simvastatin and Dyazide [hydrochlorothiazide w-triamterene]   Social History: Social History   Socioeconomic History   Marital status: Married    Spouse name: Not on file   Number of children: 2   Years of education: Not on file   Highest education level: Not on file  Occupational History   Occupation: Truck driver    Employer: [employer]     Comment: [employer]  Tobacco Use   Smoking status: Never   Smokeless tobacco: Never  Vaping Use   Vaping Use: Never used  Substance and Sexual Activity   Alcohol use: No   Drug use: No   Sexual activity: Not on file  Other Topics Concern   Not on file  Social History Narrative   Married, Ex athlete.  Truck driver for [employer]   Now on long trips 5 days per week and changes sleep schedule up to 10 hour trips. Long Hours. 50 hours per week.   Does physical loading.  [employer]    Social Determinants of Health   Financial Resource Strain: Low Risk  (07/17/2022)   Overall Financial Resource Strain (CARDIA)    Difficulty of Paying Living Expenses: Not hard at all  Food Insecurity: No Food Insecurity (07/17/2022)   Hunger Vital Sign    Worried About Running  Out of Food in the Last Year: Never true    Ran Out of Food in the Last Year: Never true  Transportation Needs: No Transportation Needs (07/17/2022)   PRAPARE - Transportation    Lack of Transportation (Medical): No    Lack of Transportation (Non-Medical): No  Physical Activity: Sufficiently Active (07/17/2022)   Exercise Vital Sign    Days of Exercise per Week: 5 days    Minutes of Exercise per Session: 30 min  Stress: No Stress Concern Present (07/17/2022)   [employer] - Occupational Stress Questionnaire    Feeling of Stress : Not at all  Social Connections: Moderately Isolated (07/17/2022)   Social Connection and Isolation Panel [NHANES]    Frequency of Communication with Friends and Family: More than three times a week    Frequency of Social Gatherings with Friends and Family: More than three times a week    Attends Religious Services: Never    Active Member of Clubs or Organizations: No    Attends Club or Organization Meetings: Never    Marital Status: Married     Family History: The patient's family history includes Alcohol abuse in an other family member; Asthma in an other family member; Diabetes in his mother and another family member; Stomach cancer in his father; Stroke in his mother.  ROS:   All other ROS reviewed and negative. Pertinent positives noted in the HPI.     EKGs/Labs/Other Studies Reviewed:   The following studies were personally reviewed by me today:  EKG:  EKG is ordered today.  The ekg ordered today demonstrates sinus bradycardia heart rate 45, PR interval 108 ms, no obvious delta wave noted, and was personally reviewed by me.   [facility] 06/22/2022 Patient had a min HR of 34 bpm, max HR of 242 bpm, and avg HR of 63 bpm. Predominant underlying rhythm was Sinus Rhythm. Slight P wave morphology changes were noted. 47 Supraventricular Tachycardia runs occurred, the run with the fastest interval lasting  8 beats with a max rate of 162 bpm,  the longest lasting 22.3 secs with an avg rate of 99 bpm. Atrial Fibrillation occurred (18% burden), ranging from 43-242 bpm (avg of 106 bpm), the longest lasting 23 hours 15 mins with an avg rate of 86 bpm.  Idioventricular Rhythm was present. Atrial Fibrillation was detected within +/- 45 seconds of symptomatic patient event(s). Isolated SVEs were occasional (2.4%, 26877), SVE Couplets were rare (<1.0%, 407), and SVE   Triplets were rare (<1.0%, 72). Isolated  VEs were rare (<1.0%), VE Couplets were rare (<1.0%), and no VE Triplets were present. Ventricular Bigeminy and Trigeminy were present. MD notification criteria for Rapid Atrial Fibrillation met - report posted prior to notification per account request  (MV).  Recent Labs: 06/21/2022: ALT 16; BUN 12; Creatinine, Ser 0.97; Hemoglobin 14.4; Platelets 179.0; Potassium 4.1; Sodium 141; TSH 0.35   Recent Lipid Panel    Component Value Date/Time   CHOL 158 06/21/2022 1059   TRIG 104.0 06/21/2022 1059   HDL 32.40 (L) 06/21/2022 1059   CHOLHDL 5 06/21/2022 1059   VLDL 20.8 06/21/2022 1059   LDLCALC 105 (H) 06/21/2022 1059    Physical Exam:   VS:  BP 122/78   Pulse 78   Ht 6' 1" (1.854 m)   Wt 181 lb 12.8 oz (82.5 kg)   SpO2 96%   BMI 23.99 kg/m    Wt Readings from Last 3 Encounters:  07/20/22 181 lb 12.8 oz (82.5 kg)  07/17/22 181 lb (82.1 kg)  06/21/22 181 lb 9.6 oz (82.4 kg)    General: Well nourished, well developed, in no acute distress Head: Atraumatic, normal size  Eyes: PEERLA, EOMI  Neck: Supple, no JVD Endocrine: No thryomegaly Cardiac: Normal S1, S2; RRR; no murmurs, rubs, or gallops Lungs: Clear to auscultation bilaterally, no wheezing, rhonchi or rales  Abd: Soft, nontender, no hepatomegaly  Ext: No edema, pulses 2+ Musculoskeletal: No deformities, BUE and BLE strength normal and equal Skin: Warm and dry, no rashes   Neuro: Alert and oriented to person, place, time, and situation, CNII-XII grossly intact, no focal  deficits  Psych: Normal mood and affect   ASSESSMENT:   James Nunez is a 66 y.o. male who presents for the following: 1. PAF (paroxysmal atrial fibrillation) (HCC)     PLAN:   1. PAF (paroxysmal atrial fibrillation) (HCC) -18% A-fib burden.  Symptoms are infrequent.  Occurring once per month.  We discussed rhythm medication versus ablation.  For now he would like to hold on this.  We discussed switching to diltiazem 240 mg standard release to see if this helps.  He would like to do this.  Anticoagulation is merited due to his CHA2DS2-VASc of 2.  We will start him on Eliquis 5 mg twice daily.   -His EKG demonstrates sinus bradycardia with a short PR interval.  I do not see an obvious delta wave. His monitor shows his Afib can get him up to 240 bpm. No syncope. I would like for him to see EP for evaluation.  -Echo pending.      Disposition: Return in about 3 months (around 10/19/2022).  Medication Adjustments/Labs and Tests Ordered: Current medicines are reviewed at length with the patient today.  Concerns regarding medicines are outlined above.  Orders Placed This Encounter  Procedures   EKG 12-Lead   Meds ordered this encounter  Medications   apixaban (ELIQUIS) 5 MG TABS tablet    Sig: Take 1 tablet (5 mg total) by mouth 2 (two) times daily.    Dispense:  60 tablet    Refill:  3   diltiazem (CARDIZEM CD) 240 MG 24 hr capsule    Sig: Take 1 capsule (240 mg total) by mouth daily.    Dispense:  90 capsule    Refill:  3    Patient Instructions  Medication Instructions:  STOP Aspirin STOP Verapamil   START Eliquis 5 mg twice daily  START Diltazem 240 mg   daily   *If you need a refill on your cardiac medications before your next appointment, please call your pharmacy*   Follow-Up: At [facility], you and your health needs are our priority.  As part of our continuing mission to provide you with exceptional heart care, we have created designated Provider Care  Teams.  These Care Teams include your primary Cardiologist (physician) and Advanced Practice Providers (APPs -  Physician Assistants and Nurse Practitioners) who all work together to provide you with the care you need, when you need it.  We recommend signing up for the patient portal called "MyChart".  Sign up information is provided on this After Visit Summary.  MyChart is used to connect with patients for Virtual Visits (Telemedicine).  Patients are able to view lab/test results, encounter notes, upcoming appointments, etc.  Non-urgent messages can be sent to your provider as well.   To learn more about what you can do with MyChart, go to [email or web address].    Your next appointment:   3 month(s)  The format for your next appointment:   In Person  Provider:   Evalina Field, MD          Time Spent with Patient: I have spent a total of 35 minutes with patient reviewing hospital notes, telemetry, EKGs, labs and examining the patient as well as establishing an assessment and plan that was discussed with the patient.  > 50% of time was spent in direct patient care.  Signed, Addison Naegeli. Audie Box, MD, [place]  [address], [place], [place] [address] [phone]  07/20/2022 5:07 PM

## 2022-07-19 NOTE — Telephone Encounter (Signed)
[facility] calling to give abnormal cardiac results.

## 2022-07-20 ENCOUNTER — Ambulatory Visit: Payer: Medicare HMO | Attending: Cardiovascular Disease | Admitting: Cardiovascular Disease

## 2022-07-20 ENCOUNTER — Encounter: Payer: Self-pay | Admitting: Cardiovascular Disease

## 2022-07-20 ENCOUNTER — Telehealth: Payer: Self-pay

## 2022-07-20 ENCOUNTER — Other Ambulatory Visit: Payer: Self-pay

## 2022-07-20 VITALS — BP 122/78 | HR 78 | Ht 73.0 in | Wt 181.8 lb

## 2022-07-20 DIAGNOSIS — I480 Paroxysmal atrial fibrillation: Secondary | ICD-10-CM

## 2022-07-20 MED ORDER — DILTIAZEM HCL ER COATED BEADS 240 MG PO CP24: 240.0000 mg | ORAL_CAPSULE | Freq: Every day | ORAL | 3 refills | Status: DC

## 2022-07-20 MED ORDER — APIXABAN 5 MG PO TABS: 5.0000 mg | ORAL_TABLET | Freq: Two times a day (BID) | ORAL | 3 refills | Status: DC

## 2022-07-20 NOTE — Telephone Encounter (Signed)
Called patient- he was seen in office today 09/14- Dr.O'Neal spoke with EP provider [facility]) both agreed he should be seen by EP. I did send over referral, made patient aware he would be in contact by the office for an appointment.   Patient verbalized understanding thankful for call.

## 2022-07-20 NOTE — Patient Instructions (Signed)
Medication Instructions:  STOP Aspirin STOP Verapamil   START Eliquis 5 mg twice daily  START Diltazem 240 mg daily   *If you need a refill on your cardiac medications before your next appointment, please call your pharmacy*   Follow-Up: At [facility], you and your health needs are our priority.  As part of our continuing mission to provide you with exceptional heart care, we have created designated Provider Care Teams.  These Care Teams include your primary Cardiologist (physician) and Advanced Practice Providers (APPs -  Physician Assistants and Nurse Practitioners) who all work together to provide you with the care you need, when you need it.  We recommend signing up for the patient portal called "MyChart".  Sign up information is provided on this After Visit Summary.  MyChart is used to connect with patients for Virtual Visits (Telemedicine).  Patients are able to view lab/test results, encounter notes, upcoming appointments, etc.  Non-urgent messages can be sent to your provider as well.   To learn more about what you can do with MyChart, go to [email or web address].    Your next appointment:   3 month(s)  The format for your next appointment:   In Person  Provider:   Evalina Field, MD

## 2022-07-24 ENCOUNTER — Ambulatory Visit (HOSPITAL_COMMUNITY): Payer: Medicare HMO | Attending: Cardiovascular Disease

## 2022-07-24 DIAGNOSIS — I480 Paroxysmal atrial fibrillation: Secondary | ICD-10-CM | POA: Insufficient documentation

## 2022-07-24 LAB — ECHOCARDIOGRAM COMPLETE
Area-P 1/2: 2.09 cm2
S' Lateral: 3.5 cm

## 2022-07-24 LAB — COLOGUARD

## 2022-07-27 NOTE — Progress Notes (Signed)
So  not sure what this results means except cannot say if positive or negative.  Other option are   IFOB stool test every year ( kit from lab) or colonoscopy

## 2022-08-09 ENCOUNTER — Ambulatory Visit (INDEPENDENT_AMBULATORY_CARE_PROVIDER_SITE_OTHER): Payer: Medicare HMO

## 2022-08-09 DIAGNOSIS — Z23 Encounter for immunization: Secondary | ICD-10-CM

## 2022-09-06 NOTE — Progress Notes (Deleted)
Electrophysiology Office Note:    Date:  09/06/2022   ID:  James Nunez, DOB [birth year], MRN [id number]  PCP:  Burnis Medin, MD  [facility] Cardiologist:  Evalina Field, MD  [facility] Electrophysiologist:  None   Referring MD: Geralynn Rile, *   Chief Complaint: Atrial fibrillation  History of Present Illness:    James Nunez is a 66 y.o. male who presents for an evaluation of atrial fibrillation at the request of Dr. Audie Box. Their medical history includes atrial fibrillation, hypertension, hypothyroidism.  The patient last saw Dr. Audie Box July 20, 2022.  He wore a heart monitor recently which showed an 18% A-fib burden.  He is on Eliquis for stroke prophylaxis.  Treatment options were discussed with the patient at the initial appointment with Dr. Audie Box but he deferred further evaluation at that time.     Past Medical History:  Diagnosis Date   Allergic rhinitis    Asthma    ?   Atrial fibrillation, persistent (HCC)    Paroxysmal   Cardiomyopathy    related to hyperthyroid condition   Hypertension    Hypothyroidism    s/p hyperthyroid heart failure and RAI rx   Low HDL (under 40)    Palpitations 9/38/1017   Umbilical hernia    surgery consult no intervention if no sx     Past Surgical History:  Procedure Laterality Date   ABDOMINAL SURGERY     no surgical history      Current Medications: No outpatient medications have been marked as taking for the 09/07/22 encounter (Appointment) with Vickie Epley, MD.     Allergies:   Simvastatin and Dyazide [hydrochlorothiazide w-triamterene]   Social History   Socioeconomic History   Marital status: Married    Spouse name: Not on file   Number of children: 2   Years of education: Not on file   Highest education level: Not on file  Occupational History   Occupation: Truck Education administrator: [employer]  Tobacco Use   Smoking status: Never   Smokeless  tobacco: Never  Vaping Use   Vaping Use: Never used  Substance and Sexual Activity   Alcohol use: No   Drug use: No   Sexual activity: Not on file  Other Topics Concern   Not on file  Social History Narrative   Married, 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 Long Hours. 50 hours per week.   Does physical loading.  [place]    Social Determinants of Health   Financial Resource Strain: Low Risk  (07/17/2022)   Overall Financial Resource Strain (CARDIA)    Difficulty of Paying Living Expenses: Not hard at all  Food Insecurity: No Food Insecurity (07/17/2022)   Hunger Vital Sign    Worried About Running Out of Food in the Last Year: Never true    Ran Out of Food in the Last Year: Never true  Transportation Needs: No Transportation Needs (07/17/2022)   PRAPARE - Hydrologist (Medical): No    Lack of Transportation (Non-Medical): No  Physical Activity: Sufficiently Active (07/17/2022)   Exercise Vital Sign    Days of Exercise per Week: 5 days    Minutes of Exercise per Session: 30 min  Stress: No Stress Concern Present (07/17/2022)  [employer] of Occupational Health - Occupational Stress Questionnaire    Feeling of Stress : Not at all  Social Connections: Moderately Isolated (07/17/2022)   Social Connection and Isolation Panel [NHANES]    Frequency of Communication with Friends and Family: More than three times a week    Frequency of Social Gatherings with Friends and Family: More than three times a week    Attends Religious Services: Never    Marine scientist or Organizations: No    Attends Music therapist: Never    Marital Status: Married     Family History: The patient's family history includes Alcohol abuse in an other family member; Asthma in an other family member; Diabetes in his mother and another family member; Stomach cancer in  his father; Stroke in his mother.  ROS:   Please see the history of present illness.    All other systems reviewed and are negative.  EKGs/Labs/Other Studies Reviewed:    The following studies were reviewed today:  July 24, 2022 echo EF normal 60% RV normal Mild MR Mildly dilated left atrium  July 23, 2022 ZIO monitor Heart rate 34-242, average 63 18% A-fib burden A-fib heart rate max 242 bpm  July 20, 2022 EKG shows sinus bradycardia.  Short PR but no obvious preexcitation.    Recent Labs: 06/21/2022: ALT 16; BUN 12; Creatinine, Ser 0.97; Hemoglobin 14.4; Platelets 179.0; Potassium 4.1; Sodium 141; TSH 0.35  Recent Lipid Panel    Component Value Date/Time   CHOL 158 06/21/2022 1059   TRIG 104.0 06/21/2022 1059   HDL 32.40 (L) 06/21/2022 1059   CHOLHDL 5 06/21/2022 1059   VLDL 20.8 06/21/2022 1059   LDLCALC 105 (H) 06/21/2022 1059    Physical Exam:    VS:  There were no vitals taken for this visit.    Wt Readings from Last 3 Encounters:  07/20/22 181 lb 12.8 oz (82.5 kg)  07/17/22 181 lb (82.1 kg)  06/21/22 181 lb 9.6 oz (82.4 kg)     GEN: *** Well nourished, well developed in no acute distress HEENT: Normal NECK: No JVD; No carotid bruits LYMPHATICS: No lymphadenopathy CARDIAC: ***RRR, no murmurs, rubs, gallops RESPIRATORY:  Clear to auscultation without rales, wheezing or rhonchi  ABDOMEN: Soft, non-tender, non-distended MUSCULOSKELETAL:  No edema; No deformity  SKIN: Warm and dry NEUROLOGIC:  Alert and oriented x 3 PSYCHIATRIC:  Normal affect       ASSESSMENT:    No diagnosis found. PLAN:    In order of problems listed above:  #Atrial fibrillation, paroxysmal  Discussed treatment options today for their AF including antiarrhythmic drug therapy and ablation. Discussed risks, recovery and likelihood of success. Discussed potential need for repeat ablation procedures and antiarrhythmic drugs after an initial ablation. They wish to  proceed with scheduling.  Risk, benefits, and alternatives to EP study and radiofrequency ablation for afib were also discussed in detail today. These risks include but are not limited to stroke, bleeding, vascular damage, tamponade, perforation, damage to the esophagus, lungs, and other structures, pulmonary vein stenosis, worsening renal function, and death. The patient understands these risk and wishes to proceed.  We will therefore proceed with catheter ablation at the next available time.  Carto, ICE, anesthesia are requested for the procedure.  Will also obtain CT PV protocol prior to the procedure to exclude LAA thrombus and further evaluate atrial anatomy.        Total time spent with patient today *** minutes. This includes reviewing  records, evaluating the patient and coordinating care.  Medication Adjustments/Labs and Tests Ordered: Current medicines are reviewed at length with the patient today.  Concerns regarding medicines are outlined above.  No orders of the defined types were placed in this encounter.  No orders of the defined types were placed in this encounter.    Signed, Hilton Cork. Quentin Ore, MD, [facility], [facility] 09/06/2022 1:32 PM    Electrophysiology [facility]

## 2022-09-07 ENCOUNTER — Institutional Professional Consult (permissible substitution): Payer: Medicare HMO | Admitting: Cardiology

## 2022-09-07 DIAGNOSIS — I480 Paroxysmal atrial fibrillation: Secondary | ICD-10-CM

## 2022-09-29 ENCOUNTER — Other Ambulatory Visit: Payer: Medicare HMO

## 2022-10-03 ENCOUNTER — Other Ambulatory Visit: Payer: Medicare HMO

## 2022-10-03 DIAGNOSIS — E039 Hypothyroidism, unspecified: Secondary | ICD-10-CM

## 2022-10-03 LAB — TSH: TSH: 1.2 u[IU]/mL (ref 0.35–5.50)

## 2022-10-03 NOTE — Progress Notes (Signed)
Tsh in a better range   confirm thyroid dosing and make sure accurate in med list   . Continue same dosing

## 2022-10-11 DIAGNOSIS — E039 Hypothyroidism, unspecified: Secondary | ICD-10-CM | POA: Diagnosis not present

## 2022-10-11 DIAGNOSIS — I10 Essential (primary) hypertension: Secondary | ICD-10-CM | POA: Diagnosis not present

## 2022-10-11 DIAGNOSIS — Z7901 Long term (current) use of anticoagulants: Secondary | ICD-10-CM | POA: Diagnosis not present

## 2022-10-11 DIAGNOSIS — I4891 Unspecified atrial fibrillation: Secondary | ICD-10-CM | POA: Diagnosis not present

## 2022-10-11 DIAGNOSIS — Z833 Family history of diabetes mellitus: Secondary | ICD-10-CM | POA: Diagnosis not present

## 2022-11-13 ENCOUNTER — Ambulatory Visit: Payer: Medicare HMO | Admitting: Cardiovascular Disease

## 2022-11-18 ENCOUNTER — Other Ambulatory Visit: Payer: Self-pay | Admitting: Cardiovascular Disease

## 2022-11-20 NOTE — Telephone Encounter (Signed)
Prescription refill request for Eliquis received. Indication: PAF Last office visit: 07/20/22  Dorann Lodge MD Scr: 0.97 on 06/21/22 Age: 67 Weight: 82.5kg  Based on above findings Eliquis '5mg'$  twice daily is the appropriate dose.  Refill approved.

## 2022-12-28 ENCOUNTER — Ambulatory Visit: Payer: Medicare HMO | Admitting: Internal Medicine

## 2023-05-08 ENCOUNTER — Other Ambulatory Visit: Payer: Self-pay | Admitting: Cardiovascular Disease

## 2023-05-08 NOTE — Telephone Encounter (Signed)
Prescription refill request for Eliquis received. Indication: PAF Last office visit: 07/20/22  Carylon Perches MD Scr: 0.97  on 06/24/22  Epic Age: 67 Weight: 82.5kg  Based on above findings Eliquis 5mg  twice daily is the appropriate dose.  Refill approved.

## 2023-06-02 ENCOUNTER — Other Ambulatory Visit: Payer: Self-pay | Admitting: Cardiovascular Disease

## 2023-06-04 ENCOUNTER — Encounter: Payer: Self-pay | Admitting: Internal Medicine

## 2023-06-05 MED ORDER — ALBUTEROL SULFATE HFA 108 (90 BASE) MCG/ACT IN AERS: INHALATION_SPRAY | RESPIRATORY_TRACT | 3 refills | Status: AC

## 2023-06-06 ENCOUNTER — Encounter (INDEPENDENT_AMBULATORY_CARE_PROVIDER_SITE_OTHER): Payer: Self-pay

## 2023-06-21 ENCOUNTER — Encounter (INDEPENDENT_AMBULATORY_CARE_PROVIDER_SITE_OTHER): Payer: Self-pay

## 2023-06-22 ENCOUNTER — Other Ambulatory Visit: Payer: Self-pay | Admitting: Internal Medicine

## 2023-06-25 NOTE — Progress Notes (Unsigned)
No chief complaint on file.   HPI: Patient  James Nunez  67 y.o. comes in today for Preventive Health Care visit   Cards:  dr Scharlene Gloss 9 23  anticoagulated and 18% a fib burden  Health Maintenance  Topic Date Due   Zoster Vaccines- Shingrix (1 of 2) Never done   DTaP/Tdap/Td (2 - Tdap) 11/06/2006   COVID-19 Vaccine (2 - Janssen risk series) 03/13/2020   INFLUENZA VACCINE  06/07/2023   Medicare Annual Wellness (AWV)  07/18/2023   Colonoscopy  07/18/2023 (Originally 11/06/2018)   Pneumonia Vaccine 34+ Years old (3 of 3 - PPSV23 or PCV20) 11/09/2024   Hepatitis C Screening  Completed   HPV VACCINES  Aged Out   Health Maintenance Review LIFESTYLE:  Exercise:   Tobacco/ETS: Alcohol:  Sugar beverages: Sleep: Drug use: no HH of  Work:    ROS:  GEN/ HEENT: No fever, significant weight changes sweats headaches vision problems hearing changes, CV/ PULM; No chest pain shortness of breath cough, syncope,edema  change in exercise tolerance. GI /GU: No adominal pain, vomiting, change in bowel habits. No blood in the stool. No significant GU symptoms. SKIN/HEME: ,no acute skin rashes suspicious lesions or bleeding. No lymphadenopathy, nodules, masses.  NEURO/ PSYCH:  No neurologic signs such as weakness numbness. No depression anxiety. IMM/ Allergy: No unusual infections.  Allergy .   REST of 12 system review negative except as per HPI   Past Medical History:  Diagnosis Date   Allergic rhinitis    Asthma    ?   Atrial fibrillation, persistent (HCC)    Paroxysmal   Cardiomyopathy    related to hyperthyroid condition   Hypertension    Hypothyroidism    s/p hyperthyroid heart failure and RAI rx   Low HDL (under 40)    Palpitations 05/19/2013   Umbilical hernia    surgery consult no intervention if no sx     Past Surgical History:  Procedure Laterality Date   ABDOMINAL SURGERY     no surgical history      Family History  Problem Relation Age of Onset   Stroke  Mother    Diabetes Mother    Stomach cancer Father    Diabetes Other    Asthma Other        sibs   Alcohol abuse Other        sib    Social History   Socioeconomic History   Marital status: Married    Spouse name: Not on file   Number of children: 2   Years of education: Not on file   Highest education level: Not on file  Occupational History   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 Long Hours. 50 hours per week.   Does physical loading.  [employer]    Social Determinants of Health   Financial Resource Strain: Low Risk  (07/17/2022)   Overall Financial Resource Strain (CARDIA)  Difficulty of Paying Living Expenses: Not hard at all  Food Insecurity: No Food Insecurity (07/17/2022)   Hunger Vital Sign    Worried About Running Out of Food in the Last Year: Never true    Ran Out of Food in the Last Year: Never true  Transportation Needs: No Transportation Needs (07/17/2022)   PRAPARE - Administrator, Civil Service (Medical): No    Lack of Transportation (Non-Medical): No  Physical Activity: Sufficiently Active (07/17/2022)   Exercise Vital Sign    Days of Exercise per Week: 5 days    Minutes of Exercise per Session: 30 min  Stress: No Stress Concern Present (07/17/2022)   [employer] of Occupational Health - Occupational Stress Questionnaire    Feeling of Stress : Not at all  Social Connections: Moderately Isolated (07/17/2022)   Social Connection and Isolation Panel [NHANES]    Frequency of Communication with  Friends and Family: More than three times a week    Frequency of Social Gatherings with Friends and Family: More than three times a week    Attends Religious Services: Never    Database administrator or Organizations: No    Attends Engineer, structural: Never    Marital Status: Married    Outpatient Medications Prior to Visit  Medication Sig Dispense Refill   albuterol (VENTOLIN HFA) 108 (90 Base) MCG/ACT inhaler INHALE 1 TO 2 PUFFS BY MOUTH EVERY 6 HOURS AS NEEDED FOR ASTHMA 18 g 3   diltiazem (CARDIZEM CD) 240 MG 24 hr capsule Take 1 capsule (240 mg total) by mouth daily. 90 capsule 3   ELIQUIS 5 MG TABS tablet TAKE 1 TABLET BY MOUTH TWICE A DAY 60 tablet 5   levothyroxine (SYNTHROID) 100 MCG tablet TAKE 1 TABLET BY MOUTH EVERY DAY 90 tablet 3   valsartan (DIOVAN) 160 MG tablet TAKE 1 TABLET (160 MG TOTAL) BY MOUTH DAILY. DOSAGE CHANGE 90 tablet 3   No facility-administered medications prior to visit.     EXAM:  There were no vitals taken for this visit.  There is no height or weight on file to calculate BMI. Wt Readings from Last 3 Encounters:  07/20/22 181 lb 12.8 oz (82.5 kg)  07/17/22 181 lb (82.1 kg)  06/21/22 181 lb 9.6 oz (82.4 kg)    Physical Exam: Vital signs reviewed [id number]:WRUE is a well-developed well-nourished alert cooperative    who appearsr stated age in no acute distress.  HEENT: normocephalic atraumatic , Eyes: PERRL EOM's full, conjunctiva clear, Nares: paten,t no deformity discharge or tenderness., Ears: no deformity EAC's clear TMs with normal landmarks. Mouth: clear OP, no lesions, edema.  Moist mucous membranes. Dentition in adequate repair. NECK: supple without masses, thyromegaly or bruits. CHEST/PULM:  Clear to auscultation and percussion breath sounds equal no wheeze , rales or rhonchi. No chest wall deformities or tenderness. Breast: normal by inspection . No dimpling, discharge, masses, tenderness or discharge . CV: PMI is nondisplaced, S1 S2  no gallops, murmurs, rubs. Peripheral pulses are full without delay.No JVD .  ABDOMEN: Bowel sounds normal nontender  No guard or rebound, no hepato splenomegal no CVA tenderness.  No hernia. Extremtities:  No clubbing cyanosis or edema, no acute joint swelling or redness no focal atrophy NEURO:  Oriented x3, cranial nerves 3-12 appear to be intact, no obvious focal weakness,gait within normal limits no abnormal reflexes or asymmetrical SKIN: No acute rashes normal turgor, color, no bruising or petechiae. PSYCH: Oriented, good eye contact, no  obvious depression anxiety, cognition and judgment appear normal. LN: no cervical axillary inguinal adenopathy  Lab Results  Component Value Date   WBC 5.7 06/21/2022   HGB 14.4 06/21/2022   HCT 44.4 06/21/2022   PLT 179.0 06/21/2022   GLUCOSE 96 06/21/2022   CHOL 158 06/21/2022   TRIG 104.0 06/21/2022   HDL 32.40 (L) 06/21/2022   LDLCALC 105 (H) 06/21/2022   ALT 16 06/21/2022   AST 18 06/21/2022   NA 141 06/21/2022   K 4.1 06/21/2022   CL 104 06/21/2022   CREATININE 0.97 06/21/2022   BUN 12 06/21/2022   CO2 30 06/21/2022   TSH 1.20 10/03/2022   PSA 0.68 06/21/2022   HGBA1C 6.2 06/21/2022    BP Readings from Last 3 Encounters:  07/20/22 122/78  06/21/22 (!) 148/92  06/20/21 140/70    Lab results reviewed with patient   ASSESSMENT AND PLAN:  Discussed the following assessment and plan:    ICD-10-CM   1. Essential hypertension  I10     2. Hypothyroidism, unspecified type  E03.9     3. Low HDL (under 40)  E78.6     4. Medication management  Z79.899     5. PAF (paroxysmal atrial fibrillation) (HCC)  I48.0     Update labs  No follow-ups on file.  Patient Care Team: Madelin Headings, MD as PCP - General O'Neal, Ronnald Ramp, MD as PCP - Cardiology (Cardiology) Marykay Lex, MD as Consulting Physician (Cardiology) There are no Patient Instructions on file for this visit.  Neta Mends. Clayvon Parlett M.D.

## 2023-06-26 ENCOUNTER — Other Ambulatory Visit: Payer: Self-pay | Admitting: Cardiovascular Disease

## 2023-06-26 ENCOUNTER — Encounter: Payer: Self-pay | Admitting: Internal Medicine

## 2023-06-26 ENCOUNTER — Ambulatory Visit: Payer: Medicare HMO | Admitting: Internal Medicine

## 2023-06-26 VITALS — BP 150/78 | HR 52 | Temp 98.2°F | Ht 72.75 in | Wt 186.6 lb

## 2023-06-26 DIAGNOSIS — E039 Hypothyroidism, unspecified: Secondary | ICD-10-CM | POA: Diagnosis not present

## 2023-06-26 DIAGNOSIS — Z0000 Encounter for general adult medical examination without abnormal findings: Secondary | ICD-10-CM | POA: Diagnosis not present

## 2023-06-26 DIAGNOSIS — Z125 Encounter for screening for malignant neoplasm of prostate: Secondary | ICD-10-CM | POA: Diagnosis not present

## 2023-06-26 DIAGNOSIS — E786 Lipoprotein deficiency: Secondary | ICD-10-CM

## 2023-06-26 DIAGNOSIS — Z79899 Other long term (current) drug therapy: Secondary | ICD-10-CM

## 2023-06-26 DIAGNOSIS — I10 Essential (primary) hypertension: Secondary | ICD-10-CM | POA: Diagnosis not present

## 2023-06-26 DIAGNOSIS — I480 Paroxysmal atrial fibrillation: Secondary | ICD-10-CM | POA: Diagnosis not present

## 2023-06-26 DIAGNOSIS — R351 Nocturia: Secondary | ICD-10-CM

## 2023-06-26 DIAGNOSIS — Z23 Encounter for immunization: Secondary | ICD-10-CM | POA: Diagnosis not present

## 2023-06-26 DIAGNOSIS — R7301 Impaired fasting glucose: Secondary | ICD-10-CM

## 2023-06-26 LAB — HEMOGLOBIN A1C: Hgb A1c MFr Bld: 6.3 % (ref 4.6–6.5)

## 2023-06-26 LAB — COMPREHENSIVE METABOLIC PANEL
ALT: 12 U/L (ref 0–53)
AST: 16 U/L (ref 0–37)
Albumin: 4 g/dL (ref 3.5–5.2)
Alkaline Phosphatase: 93 U/L (ref 39–117)
BUN: 7 mg/dL (ref 6–23)
CO2: 31 mEq/L (ref 19–32)
Calcium: 9 mg/dL (ref 8.4–10.5)
Chloride: 102 mEq/L (ref 96–112)
Creatinine, Ser: 0.91 mg/dL (ref 0.40–1.50)
GFR: 86.27 mL/min (ref >60.00)
Glucose, Bld: 124 mg/dL — ABNORMAL HIGH (ref 70–99)
Potassium: 3.3 mEq/L — ABNORMAL LOW (ref 3.5–5.1)
Sodium: 140 mEq/L (ref 135–145)
Total Bilirubin: 0.5 mg/dL (ref 0.2–1.2)
Total Protein: 8.1 g/dL (ref 6.0–8.3)

## 2023-06-26 LAB — CBC WITH DIFFERENTIAL/PLATELET
Basophils Absolute: 0 10*3/uL (ref 0.0–0.1)
Basophils Relative: 0.5 % (ref 0.0–3.0)
Eosinophils Absolute: 0.1 10*3/uL (ref 0.0–0.7)
Eosinophils Relative: 1.4 % (ref 0.0–5.0)
HCT: 42.5 % (ref 39.0–52.0)
Hemoglobin: 14.1 g/dL (ref 13.0–17.0)
Lymphocytes Relative: 32.1 % (ref 12.0–46.0)
Lymphs Abs: 1.8 10*3/uL (ref 0.7–4.0)
MCHC: 33.1 g/dL (ref 30.0–36.0)
MCV: 87.8 fl (ref 78.0–100.0)
Monocytes Absolute: 0.5 10*3/uL (ref 0.1–1.0)
Monocytes Relative: 9.2 % (ref 3.0–12.0)
Neutro Abs: 3.3 10*3/uL (ref 1.4–7.7)
Neutrophils Relative %: 56.8 % (ref 43.0–77.0)
Platelets: 191 10*3/uL (ref 150.0–400.0)
RBC: 4.84 Mil/uL (ref 4.22–5.81)
RDW: 13.8 % (ref 11.5–15.5)
WBC: 5.8 10*3/uL (ref 4.0–10.5)

## 2023-06-26 LAB — LIPID PANEL
Cholesterol: 153 mg/dL (ref 0–200)
HDL: 31.6 mg/dL — ABNORMAL LOW (ref >39.00)
LDL Cholesterol: 103 mg/dL — ABNORMAL HIGH (ref 0–99)
NonHDL: 121.09
Total CHOL/HDL Ratio: 5
Triglycerides: 89 mg/dL (ref 0.0–149.0)
VLDL: 17.8 mg/dL (ref 0.0–40.0)

## 2023-06-26 LAB — PSA: PSA: 0.7 ng/mL (ref 0.10–4.00)

## 2023-06-26 LAB — TSH: TSH: 5.17 u[IU]/mL (ref 0.35–5.50)

## 2023-06-26 MED ORDER — VALSARTAN 160 MG PO TABS: 160.0000 mg | ORAL_TABLET | Freq: Two times a day (BID) | ORAL | 3 refills | Status: DC

## 2023-06-26 MED ORDER — LEVOTHYROXINE SODIUM 100 MCG PO TABS: 100.0000 ug | ORAL_TABLET | Freq: Every day | ORAL | 3 refills | Status: DC

## 2023-06-26 NOTE — Patient Instructions (Addendum)
Good to see you today . Continue lifestyle intervention healthy eating and exercise .  If frequency  is  persistent or progressive consider further evaluation.  Bp goal best down to below 130 range even 120. Increase the valsartan to 320 mg per day  ( take 2 x 160 that you have  as a trial)   Plan fu bp readings in about 2 months to assess response ( can send in readings or virtual visit )   If all ok then we can order valsrtan 320 per day.  Will have cardiology team  in the loop.   Prevnar 20 today.  Can get tdap( tetanus)  and shingrix ( shingles ) at pharmacy

## 2023-07-20 ENCOUNTER — Ambulatory Visit (INDEPENDENT_AMBULATORY_CARE_PROVIDER_SITE_OTHER): Payer: Medicare HMO

## 2023-07-20 VITALS — Ht 73.75 in | Wt 186.0 lb

## 2023-07-20 DIAGNOSIS — Z1211 Encounter for screening for malignant neoplasm of colon: Secondary | ICD-10-CM | POA: Diagnosis not present

## 2023-07-20 DIAGNOSIS — Z0000 Encounter for general adult medical examination without abnormal findings: Secondary | ICD-10-CM

## 2023-07-20 NOTE — Progress Notes (Signed)
Subjective:   James Nunez is a 67 y.o. male who presents for Medicare Annual/Subsequent preventive examination.  Visit Complete: Virtual  I connected with  James Nunez on 07/20/23 by a audio enabled telemedicine application and verified that I am speaking with the correct person using two identifiers.  Patient Location: Home  Provider Location: Home Office  I discussed the limitations of evaluation and management by telemedicine. The patient expressed understanding and agreed to proceed.   patient is correct and no changes since this date.  Cardiac Risk Factors include: advanced age (>30men, >9 women);male gender;hypertension     Objective:    Today's Vitals   07/20/23 0824  Weight: 186 lb (84.4 kg)  Height: 6' 1.75" (1.873 m)   Body mass index is 24.04 kg/m.     07/20/2023    8:31 AM 07/17/2022    8:26 AM  Advanced Directives  Does Patient Have a Medical Advance Directive? No No  Would patient like information on creating a medical advance directive? No - Patient declined No - Patient declined    Current Medications (verified) Outpatient Encounter Medications as of 07/20/2023  Medication Sig   albuterol (VENTOLIN HFA) 108 (90 Base) MCG/ACT inhaler INHALE 1 TO 2 PUFFS BY MOUTH EVERY 6 HOURS AS NEEDED FOR ASTHMA   diltiazem (CARDIZEM CD) 240 MG 24 hr capsule TAKE 1 CAPSULE BY MOUTH EVERY DAY   ELIQUIS 5 MG TABS tablet TAKE 1 TABLET BY MOUTH TWICE A DAY   levothyroxine (SYNTHROID) 100 MCG tablet Take 1 tablet (100 mcg total) by mouth daily.   valsartan (DIOVAN) 160 MG tablet Take 1 tablet (160 mg total) by mouth 2 (two) times daily. Dosage change trial 8 24   No facility-administered encounter medications on file as of 07/20/2023.    Allergies (verified) Simvastatin and Dyazide [hydrochlorothiazide w-triamterene]   History: Past Medical History:  Diagnosis Date   Allergic rhinitis    Asthma    ?   Atrial fibrillation, persistent (HCC)    Paroxysmal    Cardiomyopathy    related to hyperthyroid condition   Hypertension    Hypothyroidism    s/p hyperthyroid heart failure and RAI rx   Low HDL (under 40)    Palpitations 05/19/2013   Umbilical hernia    surgery consult no intervention if no sx    Past Surgical History:  Procedure Laterality Date   ABDOMINAL SURGERY     no surgical history     Family History  Problem Relation Age of Onset   Stroke Mother    Diabetes Mother    Stomach cancer Father    Diabetes Other    Asthma Other        sibs   Alcohol abuse Other        sib   Social History   Socioeconomic History   Marital status: Married    Spouse name: Not on file   Number of children: 2   Years of education: Not on file   Highest education level: Not on file  Occupational History   Occupation: Truck Air traffic controller: [employer]     Comment: [employer]   Tobacco Use   Smoking status: Never   Smokeless tobacco: Never  Vaping Use   Vaping status: Never Used  Substance and Sexual Activity   Alcohol use: No   Drug use: No   Sexual activity: Not on file  Other Topics Concern   Not on file  Social History Narrative  Married, 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 Long Hours. 50 hours per week.   Does physical loading.  [employer]    Social Determinants of Health   Financial Resource Strain: Low Risk  (07/20/2023)   Overall Financial Resource Strain (CARDIA)    Difficulty of Paying Living Expenses: Not hard at all  Food Insecurity: No Food Insecurity (07/20/2023)   Hunger Vital Sign    Worried About Running Out of Food in the Last Year: Never true    Ran Out of Food in the Last Year: Never true  Transportation Needs: No Transportation Needs (07/20/2023)   PRAPARE - Administrator, Civil Service (Medical): No    Lack of Transportation (Non-Medical): No  Physical Activity: Sufficiently Active (07/20/2023)    Exercise Vital Sign    Days of Exercise per Week: 5 days    Minutes of Exercise per Session: 60 min  Stress: No Stress Concern Present (07/20/2023)   [employer] of Occupational Health - Occupational Stress Questionnaire    Feeling of Stress : Not at all  Social Connections: Socially Integrated (07/20/2023)   Social Connection and Isolation Panel [NHANES]    Frequency of Communication with Friends and Family: More than three times a week    Frequency of Social Gatherings with Friends and Family: More than three times a week    Attends Religious Services: More than 4 times per year    Active Member of [employer] or Organizations: Yes    Attends Engineer, structural: More than 4 times per year    Marital Status: Married    Tobacco Counseling Counseling given: Not Answered   Clinical Intake:  Pre-visit preparation completed: Yes  Pain : No/denies pain     BMI - recorded: 24.04 Nutritional Status: BMI of 19-24  Normal Nutritional Risks: None Diabetes: No  How often do you need to have someone help you when you read instructions, pamphlets, or other written materials from your doctor or pharmacy?: 1 - Never  Interpreter Needed?: No  Information entered by :: Theresa Mulligan LPN   Activities of Daily Living    07/20/2023    8:30 AM  In your present state of health, do you have any difficulty performing the following activities:  Hearing? 0  Vision? 0  Difficulty concentrating or making decisions? 0  Walking or climbing stairs? 0  Dressing or bathing? 0  Doing errands, shopping? 0  Preparing Food and eating ? N  Using the Toilet? N  In the past six months, have you accidently leaked urine? N  Do you have problems with loss of bowel control? N  Managing your Medications? N  Managing your Finances? N  Housekeeping or managing your Housekeeping? N    Patient Care Team: Panosh, Neta Mends, MD as PCP - General O'Neal, Ronnald Ramp, MD as PCP - Cardiology  (Cardiology) Marykay Lex, MD as Consulting Physician (Cardiology)  Indicate any recent Medical Services you may have received from other than [facility] providers in the past year (date may be approximate).     Assessment:   This is a routine wellness examination for [place].  Hearing/Vision screen Hearing Screening - Comments:: Denies hearing difficulties   Vision Screening - Comments:: Wears rx glasses - up to date with routine eye exams with  [facility]   Goals Addressed  This Visit's Progress     Maintain health (pt-stated)        Travel       Depression Screen    07/20/2023    8:29 AM 06/26/2023    9:37 AM 07/17/2022    8:24 AM 06/21/2022   10:09 AM 06/20/2021   10:17 AM 11/10/2019    3:49 PM 05/20/2018    5:45 PM  PHQ 2/9 Scores  PHQ - 2 Score 0 0 0 0 0 0 0  PHQ- 9 Score 0 0 0 0 0      Fall Risk    07/20/2023    8:30 AM 06/26/2023    9:37 AM 07/17/2022    8:26 AM 06/21/2022   10:09 AM 06/20/2021    9:24 AM  Fall Risk   Falls in the past year? 0 0 0 0 0  Number falls in past yr: 0 0 0 0 0  Injury with Fall? 0 0 0 0 0  Risk for fall due to : No Fall Risks No Fall Risks No Fall Risks No Fall Risks   Follow up Falls prevention discussed Falls evaluation completed Falls prevention discussed Falls evaluation completed     MEDICARE RISK AT HOME: Medicare Risk at Home Any stairs in or around the home?: Yes If so, are there any without handrails?: No Home free of loose throw rugs in walkways, pet beds, electrical cords, etc?: Yes Adequate lighting in your home to reduce risk of falls?: Yes Life alert?: No Use of a cane, walker or w/c?: No Grab bars in the bathroom?: No Shower chair or bench in shower?: No Elevated toilet seat or a handicapped toilet?: No  TIMED UP AND GO:  Was the test performed?  No    Cognitive Function:        07/20/2023    8:32 AM 07/17/2022    8:27 AM  6CIT Screen  What Year? 0 points 0 points  What  month? 0 points 0 points  What time? 0 points 0 points  Count back from 20 0 points 0 points  Months in reverse 0 points 0 points  Repeat phrase 0 points 0 points  Total Score 0 points 0 points    Immunizations Immunization History  Administered Date(s) Administered   Fluad Quad(high Dose 65+) 11/10/2021, 08/09/2022   Influenza Split 11/04/2012   Influenza,inj,Quad PF,6+ Mos 12/08/2013, 08/28/2016   Janssen (J&J) SARS-COV-2 Vaccination 02/14/2020   PNEUMOCOCCAL CONJUGATE-20 06/26/2023   Pneumococcal Conjugate-13 11/10/2019   Pneumococcal Polysaccharide-23 03/29/2010   Td 11/06/1996      Flu Vaccine status: Due, Education has been provided regarding the importance of this vaccine. Advised may receive this vaccine at local pharmacy or Health Dept. Aware to provide a copy of the vaccination record if obtained from local pharmacy or Health Dept. Verbalized acceptance and understanding.  Pneumococcal vaccine status: Up to date  Covid-19 vaccine status: Declined, Education has been provided regarding the importance of this vaccine but patient still declined. Advised may receive this vaccine at local pharmacy or Health Dept.or vaccine clinic. Aware to provide a copy of the vaccination record if obtained from local pharmacy or Health Dept. Verbalized acceptance and understanding.  Qualifies for Shingles Vaccine? Yes   Zostavax completed No   Shingrix Completed?: No.    Education has been provided regarding the importance of this vaccine. Patient has been advised to call insurance company to determine out of pocket expense if they have not yet received this vaccine. Advised may  also receive vaccine at local pharmacy or Health Dept. Verbalized acceptance and understanding.  Screening Tests Health Maintenance  Topic Date Due   Colonoscopy  11/06/2018   COVID-19 Vaccine (2 - Janssen risk series) 03/13/2020   INFLUENZA VACCINE  06/07/2023   Zoster Vaccines- Shingrix (1 of 2) 09/26/2023  (Originally 06/05/1975)   Medicare Annual Wellness (AWV)  07/19/2024   Pneumonia Vaccine 13+ Years old  Completed   Hepatitis C Screening  Completed   HPV VACCINES  Aged Out   DTaP/Tdap/Td  Discontinued    Health Maintenance  Health Maintenance Due  Topic Date Due   Colonoscopy  11/06/2018   COVID-19 Vaccine (2 - Janssen risk series) 03/13/2020   INFLUENZA VACCINE  06/07/2023    Colorectal cancer screening: Referral to GI placed 07/20/23. Pt aware the office will call re: appt.    Additional Screening:  Hepatitis C Screening: does qualify; Completed 05/20/18  Vision Screening: Recommended annual ophthalmology exams for early detection of glaucoma and other disorders of the eye. Is the patient up to date with their annual eye exam?  Yes  Who is the provider or what is the name of the office in which the patient attends annual eye exams?[facility] If pt is not established with a provider, would they like to be referred to a provider to establish care? No .   Dental Screening: Recommended annual dental exams for proper oral hygiene    Community Resource Referral / Chronic Care Management:  CRR required this visit?  No   CCM required this visit?  No     Plan:     I have personally reviewed and noted the following in the patient's chart:   Medical and social history Use of alcohol, tobacco or illicit drugs  Current medications and supplements including opioid prescriptions. Patient is not currently taking opioid prescriptions. Functional ability and status Nutritional status Physical activity Advanced directives List of other physicians Hospitalizations, surgeries, and ER visits in previous 12 months Vitals Screenings to include cognitive, depression, and falls Referrals and appointments  In addition, I have reviewed and discussed with patient certain preventive protocols, quality metrics, and best practice recommendations. A written personalized  care plan for preventive services as well as general preventive health recommendations were provided to patient.     Tillie Rung, LPN   1/[id number]/[id number]   After Visit Summary: (MyChart) Due to this being a telephonic visit, the after visit summary with patients personalized plan was offered to patient via MyChart   Nurse Notes: None

## 2023-07-20 NOTE — Patient Instructions (Signed)
Mr. James Nunez , Thank you for taking time to come for your Medicare Wellness Visit. I appreciate your ongoing commitment to your health goals. Please review the following plan we discussed and let me know if I can assist you in the future.   Referrals/Orders/Follow-Ups/Clinician Recommendations:   This is a list of the screening recommended for you and due dates:  Health Maintenance  Topic Date Due   Colon Cancer Screening  11/06/2018   COVID-19 Vaccine (2 - Janssen risk series) 03/13/2020   Flu Shot  06/07/2023   Zoster (Shingles) Vaccine (1 of 2) 09/26/2023*   Medicare Annual Wellness Visit  07/19/2024   Pneumonia Vaccine  Completed   Hepatitis C Screening  Completed   HPV Vaccine  Aged Out   DTaP/Tdap/Td vaccine  Discontinued  *Topic was postponed. The date shown is not the original due date.    Advanced directives: (Declined) Advance directive discussed with you today. Even though you declined this today, please call our office should you change your mind, and we can give you the proper paperwork for you to fill out.  Next Medicare Annual Wellness Visit scheduled for next year: Yes

## 2023-07-24 ENCOUNTER — Telehealth: Payer: Self-pay | Admitting: Internal Medicine

## 2023-07-24 NOTE — Progress Notes (Signed)
Potassium is slightly low blood sugar borderline but no diabetes  Psa and blood count normal thyroid in range  Cholesterol could be better  Please increase potassium rich goods in diet  and repeat bmp and magnesium level in  3-4 weeks . ( October)

## 2023-07-24 NOTE — Telephone Encounter (Signed)
Please forward /consult with  pharmacy team to see if can get cost down for eliquis and forward to Dr Flora Lipps who is the current presciber of these meds   Also there may be samples in interim if possible..  Also see  result note for lab

## 2023-07-24 NOTE — Telephone Encounter (Signed)
Pt called and stated he cannot afford to take the Eliquis. He would like to go back to taking verapamil instead. He is requesting a call back.

## 2023-07-25 ENCOUNTER — Other Ambulatory Visit (HOSPITAL_COMMUNITY): Payer: Self-pay

## 2023-07-25 MED ORDER — WARFARIN SODIUM 5 MG PO TABS: 5.0000 mg | ORAL_TABLET | Freq: Every day | ORAL | 0 refills | Status: DC

## 2023-07-25 NOTE — Telephone Encounter (Signed)
I called and spoke with patient. I reviewed his options below. He will not qualify for eliquis pt assistance due to [facility] requirement. He state that he would rather go on warfarin. Wife is on warfarin so he is aware of the monitoring ect. He is out of Eliquis. Will start warfarin tonight. 5mg  daily. F/u with coumadin clinic in Tuesday @ 10:30. From there, he would like to be seen in [place].    Eliquis: You can apply for patient assistance through [employer] patient assistance. The website for the application is http://[email or web address]-mendoza.[email or web address]/. The website can tell you if you meet the income requirement. Note to Medicare Patients: In addition to your application, you will need to submit documentation showing you have spent 3% of your annual household income on out-of-pocket prescription expenses for you and/or other members of your household for the year in which you are seeking assistance. Your pharmacy can provide this report.  Xarelto: Xarelto is a medication similar to Eliquis, but it is taken just once a day. The drug company offers a program called Xarelto with Me Coverage Gap Support. It runs from April 1-December 31. If you are in the coverage gap, you can get Xarelto for $89 (for 30 days) or $250 (for 90 days), plus sales tax if applicable. You can call to enroll at: 888-XARELTO (819)255-2567)  Dabigatran: Dabigatran is the generic of Pradaxa (it recently went generic). Like Eliquis and Xarelto, it does not require a lot of monitoring. You might consider contacting your insurance to see what the cost of this medication is through your insurance. There is also a GoodRx coupon for ~$69-75 per month if your insurance does not cover it well.   Warfarin: Warfarin is very inexpensive but does require more monitoring. You will need to come to clinic for weekly INR (blood test) checks with our Anticoagulation Clinic for the first month. These visits may have a copay. Then as we find a stable dose for you, you will start to come less  often. You will be required to have your INR checked at least every 6 weeks. There are also more drug interactions and dietary interactions with warfarin.

## 2023-07-25 NOTE — Telephone Encounter (Signed)
Attempted to reach pt to inform him we have a sample and to inform him on lab result. Left a voicemail to call us back.

## 2023-07-25 NOTE — Telephone Encounter (Signed)
Called pharmacy, Eliquis copay is $112.16 for 30 day supply. Routing to med assist pool for assistance.

## 2023-07-26 ENCOUNTER — Other Ambulatory Visit: Payer: Self-pay

## 2023-07-26 DIAGNOSIS — Z79899 Other long term (current) drug therapy: Secondary | ICD-10-CM

## 2023-07-26 NOTE — Telephone Encounter (Signed)
Spoke to pt to follow up.  He states he took coumadin last night and will follow up with coumadin clinic next week.   Pt states no questions or concerns. No further action is needed at this time.

## 2023-07-27 ENCOUNTER — Other Ambulatory Visit: Payer: Self-pay

## 2023-07-27 NOTE — Progress Notes (Signed)
07/27/2023  Patient ID: James Nunez, male   DOB: [birth year], 67 y.o.   MRN: [id number]  Spoke with patient via telephone regarding hypertension and medication review.  Patient is currently taking Diltiazem CD 240mg  once daily, Valsartan 160mg  BID, checking BP at home 3x/week.  Reports improved BP control with dose increase of Valsartan to 160mg  BID, and BP currently at 126/88.   Instructed patient to continue current medication dosages/BP monitoring and contact us with any questions or concerns.  Sherrill Raring, PharmD Clinical Pharmacist [phone]

## 2023-07-31 ENCOUNTER — Other Ambulatory Visit: Payer: Self-pay

## 2023-07-31 ENCOUNTER — Ambulatory Visit: Payer: Medicare HMO | Attending: Cardiovascular Disease | Admitting: *Deleted

## 2023-07-31 DIAGNOSIS — I480 Paroxysmal atrial fibrillation: Secondary | ICD-10-CM

## 2023-07-31 DIAGNOSIS — Z5181 Encounter for therapeutic drug level monitoring: Secondary | ICD-10-CM | POA: Insufficient documentation

## 2023-07-31 LAB — POCT INR: INR: 1.6 — AB (ref 2.0–3.0)

## 2023-07-31 NOTE — Patient Instructions (Addendum)
A full discussion of the nature of anticoagulants has been carried out.  A benefit risk analysis has been presented to the patient, so that they understand the justification for choosing anticoagulation at this time. The need for frequent and regular monitoring, precise dosage adjustment and compliance is stressed.  Side effects of potential bleeding are discussed.  The patient should avoid any OTC items containing aspirin or ibuprofen, and should avoid great swings in general diet.  Avoid alcohol consumption.  Call if any signs of abnormal bleeding.    Description   Today take 1.5 tablets of warfarin then continue taking 1 tablet daily. Recheck INR in 1 week.  Please call Anticoagulation Clinic [phone]

## 2023-08-08 ENCOUNTER — Ambulatory Visit: Payer: Medicare HMO | Attending: Cardiology

## 2023-08-08 DIAGNOSIS — I480 Paroxysmal atrial fibrillation: Secondary | ICD-10-CM | POA: Diagnosis not present

## 2023-08-08 DIAGNOSIS — Z5181 Encounter for therapeutic drug level monitoring: Secondary | ICD-10-CM

## 2023-08-08 LAB — POCT INR: INR: 2.8 (ref 2.0–3.0)

## 2023-08-08 NOTE — Patient Instructions (Signed)
Description   Continue taking 1 tablet daily.  Recheck INR in 1 week.  Please call Anticoagulation Clinic [phone]

## 2023-08-13 ENCOUNTER — Telehealth: Payer: Self-pay

## 2023-08-13 NOTE — Progress Notes (Signed)
   08/13/2023  Patient ID: James Nunez, male   DOB: 12/30/1955, 67 y.o.   MRN: [id number]  Patient notes that he has had increased frequency of headaches since increasing Valsartan to 160mg  BID (2-3 per week). Did get relief with tylenol.  Also made note of 1-2 episodes of mild blurry vision (could still see but noted some blurriness). Did not check BP at this time. Denies any dizziness or other concerns.  Has continued to check BP about 2-3x/week and last reading of 110/60s this past Friday. Does have BMP ordered for end of this week.  Recommend patient continue medication therapy, stay hydrated, and check BP 1-2x/daily and keep a log.  F/u scheduled for next Monday.  Sherrill Raring, PharmD Clinical Pharmacist [phone]

## 2023-08-15 ENCOUNTER — Ambulatory Visit: Payer: Medicare HMO | Attending: Cardiovascular Disease

## 2023-08-15 DIAGNOSIS — I480 Paroxysmal atrial fibrillation: Secondary | ICD-10-CM | POA: Diagnosis not present

## 2023-08-15 LAB — POCT INR: INR: 3 (ref 2.0–3.0)

## 2023-08-15 NOTE — Patient Instructions (Signed)
Description   Eat serving of greens today and continue taking 1 tablet daily.  Recheck INR in 1 week.  Please call Anticoagulation Clinic [phone]

## 2023-08-16 ENCOUNTER — Other Ambulatory Visit: Payer: Self-pay | Admitting: Pharmacist

## 2023-08-16 DIAGNOSIS — I480 Paroxysmal atrial fibrillation: Secondary | ICD-10-CM

## 2023-08-16 MED ORDER — WARFARIN SODIUM 5 MG PO TABS: 5.0000 mg | ORAL_TABLET | Freq: Every day | ORAL | 0 refills | Status: DC

## 2023-08-17 ENCOUNTER — Other Ambulatory Visit (INDEPENDENT_AMBULATORY_CARE_PROVIDER_SITE_OTHER): Payer: Medicare HMO

## 2023-08-17 DIAGNOSIS — Z79899 Other long term (current) drug therapy: Secondary | ICD-10-CM

## 2023-08-17 LAB — MAGNESIUM: Magnesium: 1.7 mg/dL (ref 1.5–2.5)

## 2023-08-17 LAB — BASIC METABOLIC PANEL
BUN: 8 mg/dL (ref 6–23)
CO2: 29 meq/L (ref 19–32)
Calcium: 9 mg/dL (ref 8.4–10.5)
Chloride: 104 meq/L (ref 96–112)
Creatinine, Ser: 0.92 mg/dL (ref 0.40–1.50)
GFR: 85.06 mL/min (ref >60.00)
Glucose, Bld: 109 mg/dL — ABNORMAL HIGH (ref 70–99)
Potassium: 3.4 meq/L — ABNORMAL LOW (ref 3.5–5.1)
Sodium: 141 meq/L (ref 135–145)

## 2023-08-21 ENCOUNTER — Other Ambulatory Visit: Payer: Self-pay

## 2023-08-21 DIAGNOSIS — I480 Paroxysmal atrial fibrillation: Secondary | ICD-10-CM

## 2023-08-21 MED ORDER — WARFARIN SODIUM 5 MG PO TABS: 5.0000 mg | ORAL_TABLET | Freq: Every day | ORAL | 1 refills | Status: DC

## 2023-08-24 ENCOUNTER — Ambulatory Visit: Payer: Medicare HMO | Attending: Internal Medicine | Admitting: *Deleted

## 2023-08-24 DIAGNOSIS — I480 Paroxysmal atrial fibrillation: Secondary | ICD-10-CM

## 2023-08-24 DIAGNOSIS — Z5181 Encounter for therapeutic drug level monitoring: Secondary | ICD-10-CM | POA: Diagnosis not present

## 2023-08-24 LAB — POCT INR: POC INR: 2.3

## 2023-08-24 NOTE — Patient Instructions (Signed)
Description   Continue taking 1 tablet daily.  Recheck INR in 2 weeks.  Please call Anticoagulation Clinic [phone]

## 2023-08-27 ENCOUNTER — Telehealth: Payer: Self-pay | Admitting: Internal Medicine

## 2023-08-27 NOTE — Telephone Encounter (Signed)
Patient called re:  valsartan (DIOVAN) 160 MG tablet  Pt wants to know if he is to continue with current dose or if the dose is going to be increased to 320mg , patient stated the double dose of the 160mg  is working.  Advise pt at [phone]

## 2023-09-07 ENCOUNTER — Ambulatory Visit: Payer: Medicare HMO

## 2023-09-12 ENCOUNTER — Ambulatory Visit: Payer: Medicare HMO | Attending: Cardiovascular Disease

## 2023-09-12 DIAGNOSIS — Z5181 Encounter for therapeutic drug level monitoring: Secondary | ICD-10-CM | POA: Diagnosis not present

## 2023-09-12 DIAGNOSIS — I480 Paroxysmal atrial fibrillation: Secondary | ICD-10-CM

## 2023-09-12 LAB — POCT INR: INR: 1.5 — AB (ref 2.0–3.0)

## 2023-09-12 NOTE — Patient Instructions (Signed)
Take 2 tablets today only then Continue taking 1 tablet daily.  Recheck INR in 3 weeks.  Please call Anticoagulation Clinic [phone]

## 2023-09-21 ENCOUNTER — Other Ambulatory Visit: Payer: Self-pay | Admitting: Internal Medicine

## 2023-10-03 ENCOUNTER — Ambulatory Visit: Payer: Medicare HMO | Attending: Cardiovascular Disease

## 2023-10-03 DIAGNOSIS — Z5181 Encounter for therapeutic drug level monitoring: Secondary | ICD-10-CM | POA: Diagnosis not present

## 2023-10-03 DIAGNOSIS — I480 Paroxysmal atrial fibrillation: Secondary | ICD-10-CM

## 2023-10-03 LAB — POCT INR: INR: 1.8 — AB (ref 2.0–3.0)

## 2023-10-03 NOTE — Patient Instructions (Signed)
Take 1.5 tablets today only then Increase to 1 tablet daily, except 1.5 tablets on Tuesdays AND Thursdays. Recheck INR in 5 weeks.  Please call Anticoagulation Clinic [phone]

## 2023-10-08 NOTE — Progress Notes (Unsigned)
Cardiology Office Note:  .   Date:  10/10/2023  ID:  James Nunez, DOB 1956-08-05, MRN [id number] PCP: Madelin Headings, MD  [facility] Providers Cardiologist:  Reatha Harps, MD {  History of Present Illness: .   James Nunez is a 67 y.o. male with history of paroxysmal Afib who presents for follow-up. 18% Afib burden with minimal symptoms.    History of Present Illness   James Nunez, a 67 year old male with a history of paroxysmal atrial fibrillation (18% burden), presents for a follow-up visit. He reports no recent episodes of noticeable heart racing since his last visit. He denies any chest pain or trouble breathing and maintains an active lifestyle with regular exercise. He was recently switched from Eliquis to Coumadin due to cost concerns. He expresses a preference for Eliquis over Coumadin due to the need for regular blood tests with the latter. He is considering repricing Eliquis in the new year. His hypertension is well controlled on his current regimen, which includes valsartan, recently doubled in dose due to suboptimal blood pressure control. Pulse 46 bpm. No dizziness or light headedness.          Problem List 1. Paroxysmal Atrial fibrillation -CHADSVASC=2 (age, HTN) -18% burden  2. HTN    ROS: All other ROS reviewed and negative. Pertinent positives noted in the HPI.     Studies Reviewed: Marland Kitchen   EKG Interpretation Date/Time:  Wednesday October 10 2023 11:14:04 EST Ventricular Rate:  46 PR Interval:  164 QRS Duration:  94 QT Interval:  480 QTC Calculation: 420 R Axis:   25  Text Interpretation: Sinus bradycardia Confirmed by Lennie Odor [phone]) on 10/10/2023 11:26:09 AM    TTE 07/24/2022  1. Left ventricular ejection fraction, by estimation, is 60 to 65%. The  left ventricle has normal function. The left ventricle has no regional  wall motion abnormalities. Left ventricular diastolic parameters were  normal.   2. Right ventricular systolic  function is normal. The right ventricular  size is normal. There is normal pulmonary artery systolic pressure.   3. Left atrial size was mildly dilated.   4. The mitral valve is abnormal. Mild mitral valve regurgitation. No  evidence of mitral stenosis.   5. The aortic valve is tricuspid. Aortic valve regurgitation is not  visualized. No aortic stenosis is present.   6. The inferior vena cava is normal in size with greater than 50%  respiratory variability, suggesting right atrial pressure of 3 mmHg.  Physical Exam:   VS:  BP 118/72 (BP Location: Left Arm, Patient Position: Sitting, Cuff Size: Normal)   Pulse (!) 46   Ht 6\' 2"  (1.88 m)   Wt 189 lb 3.2 oz (85.8 kg)   SpO2 93%   BMI 24.29 kg/m    Wt Readings from Last 3 Encounters:  10/10/23 189 lb 3.2 oz (85.8 kg)  07/20/23 186 lb (84.4 kg)  06/26/23 186 lb 9.6 oz (84.6 kg)    GEN: Well nourished, well developed in no acute distress NECK: No JVD; No carotid bruits CARDIAC: RRR, no murmurs, rubs, gallops RESPIRATORY:  Clear to auscultation without rales, wheezing or rhonchi  ABDOMEN: Soft, non-tender, non-distended EXTREMITIES:  No edema; No deformity  ASSESSMENT AND PLAN: .   Assessment and Plan    Paroxysmal Atrial Fibrillation Sinus Bradycardia  No recent episodes of palpitations. Currently on Coumadin due to cost issues with Eliquis. CHADS-VASc score of 2. -Continue Coumadin for now. -Reprice Eliquis in January 2025 and  consider switching back if cost is manageable.  Heart rate of 46, likely secondary to Diltiazem; reduce Diltiazem to 120mg  daily. New prescription needed as current medication is in capsule form.  Hypertension Well controlled on current regimen. -Continue current regimen.              Follow-up: Return in about 1 year (around 10/09/2024).  Time Spent with Patient: I have spent a total of 35 minutes caring for this patient today face to face, ordering and reviewing labs/tests, reviewing prior  records/medical history, examining the patient, establishing an assessment and plan, communicating results/findings to the patient/family, and documenting in the medical record.   Signed, Lenna Gilford. Flora Lipps, MD, [facility]  [facility]  [address], [address] [place], [place] [address] [phone]  11:51 AM

## 2023-10-10 ENCOUNTER — Ambulatory Visit: Payer: Medicare HMO | Attending: Cardiovascular Disease | Admitting: Cardiovascular Disease

## 2023-10-10 ENCOUNTER — Encounter: Payer: Self-pay | Admitting: Cardiovascular Disease

## 2023-10-10 VITALS — BP 118/72 | HR 46 | Ht 74.0 in | Wt 189.2 lb

## 2023-10-10 DIAGNOSIS — I480 Paroxysmal atrial fibrillation: Secondary | ICD-10-CM

## 2023-10-10 DIAGNOSIS — I150 Renovascular hypertension: Secondary | ICD-10-CM

## 2023-10-10 MED ORDER — DILTIAZEM HCL ER COATED BEADS 120 MG PO CP24: 120.0000 mg | ORAL_CAPSULE | Freq: Every day | ORAL | 3 refills | Status: DC

## 2023-10-10 NOTE — Patient Instructions (Signed)
Medication Instructions:  - start dilitiazem 120mg  daily     *If you need a refill on your cardiac medications before your next appointment, please call your pharmacy*   Lab Work: None    If you have labs (blood work) drawn today and your tests are completely normal, you will receive your results only by: MyChart Message (if you have MyChart) OR A paper copy in the mail If you have any lab test that is abnormal or we need to change your treatment, we will call you to review the results.   Testing/Procedures: None    Follow-Up: At [facility], you and your health needs are our priority.  As part of our continuing mission to provide you with exceptional heart care, we have created designated Provider Care Teams.  These Care Teams include your primary Cardiologist (physician) and Advanced Practice Providers (APPs -  Physician Assistants and Nurse Practitioners) who all work together to provide you with the care you need, when you need it.  We recommend signing up for the patient portal called "MyChart".  Sign up information is provided on this After Visit Summary.  MyChart is used to connect with patients for Virtual Visits (Telemedicine).  Patients are able to view lab/test results, encounter notes, upcoming appointments, etc.  Non-urgent messages can be sent to your provider as well.   To learn more about what you can do with MyChart, go to [email or web address].    Your next appointment:   1 year(s)  The format for your next appointment:   In Person  Provider:   Edd Fabian, FNP, Micah Flesher, PA-C, Marjie Skiff, PA-C, Robet Leu, PA-C, Juanda Crumble, PA-C, Joni Reining, DNP, ANP, Azalee Course, PA-C, Bernadene Person, NP, or Reather Littler, NP       Other Instructions

## 2023-11-06 ENCOUNTER — Ambulatory Visit: Payer: Medicare HMO | Attending: Cardiovascular Disease

## 2023-11-06 DIAGNOSIS — I480 Paroxysmal atrial fibrillation: Secondary | ICD-10-CM

## 2023-11-06 DIAGNOSIS — Z5181 Encounter for therapeutic drug level monitoring: Secondary | ICD-10-CM | POA: Diagnosis not present

## 2023-11-06 LAB — POCT INR: INR: 2.6 (ref 2.0–3.0)

## 2023-11-06 NOTE — Patient Instructions (Signed)
 Continue 1 tablet daily, except 1.5 tablets on Tuesdays AND Thursdays. Recheck INR in 6 weeks.  Please call Anticoagulation Clinic [phone]

## 2023-11-14 ENCOUNTER — Telehealth: Payer: Self-pay | Admitting: Cardiovascular Disease

## 2023-11-14 NOTE — Telephone Encounter (Signed)
 Pt c/o medication issue:  1. Name of Medication:   diltiazem  (CARDIZEM  CD) 120 MG 24 hr capsule   2. How are you currently taking this medication (dosage and times per day)?   As prescribed  3. Are you having a reaction (difficulty breathing--STAT)?   No  4. What is your medication issue?   Patient stated starting Monday (1/6) he has been having increased HR - 60-70 on 1/6, over 80 HR on 1/7 and in the 70's yesterday.  Patient wants to know if he can increase his dosage of this medication back to 240 as it was decreased at his last office visit.

## 2023-11-14 NOTE — Telephone Encounter (Signed)
 Patient identification verified by 2 forms. Bertina Cooks, RN    Called and spoke to patient  Patient states:   -has concerned about increased heart rate   -Monday HR: 70BPM   -11/13/23 HR was ~80BPM   -this morning HR ~70BPM BP: 122/81  -Currently taking diltiazem  120mg  daily   -Sx: fatigue and mild headache   -heart rate increase started on Monday   -he started diltiazem  120mg  on 10/11/23, no issues with heart rate prior to this week   -Prior to Monday heart rate is in the 50-40s, Dr. Barbaraann was concerned this was low   -unsure if he should resume taking diltiazem  240mg   Patient denies:   -SOB/difficulty breathing   -Palpitations  Informed patient message sent for input/advisement, likely Heart okay since previously provider was concerned about bradycardia  Patient verbalized understanding, will await update

## 2023-11-15 NOTE — Telephone Encounter (Signed)
 [name], MD     I would prefer he stay on the 120. I want his heart rate in the 70-90 range. 40-50 bpm is too low.  James T. Barbaraann, MD, [facility]  [facility] [address], [address] [place], [place] [address] [phone] 6:58 PM

## 2023-11-15 NOTE — Telephone Encounter (Signed)
 Patient identification verified by 2 forms. Marilynn Rail, RN    Called and spoke to patient  Relayed provider message below  Patient verbalized understanding, no questions at this time

## 2023-12-19 ENCOUNTER — Ambulatory Visit: Payer: Medicare HMO | Attending: Cardiovascular Disease

## 2023-12-19 DIAGNOSIS — I480 Paroxysmal atrial fibrillation: Secondary | ICD-10-CM | POA: Diagnosis not present

## 2023-12-19 DIAGNOSIS — Z5181 Encounter for therapeutic drug level monitoring: Secondary | ICD-10-CM

## 2023-12-19 LAB — POCT INR: INR: 3.8 — AB (ref 2.0–3.0)

## 2023-12-19 NOTE — Patient Instructions (Signed)
Hold tonight only then Continue 1 tablet daily, except 1.5 tablets on Tuesdays AND Thursdays. Recheck INR in 4 weeks.  Please call Anticoagulation Clinic [phone]

## 2024-01-15 ENCOUNTER — Telehealth: Payer: Self-pay | Admitting: Cardiovascular Disease

## 2024-01-15 NOTE — Telephone Encounter (Signed)
 I spoke to the patient and informed him that we would rather manage his Coumadin in office to get the quicker results rather from a lab draw.

## 2024-01-15 NOTE — Telephone Encounter (Signed)
 Patient calling in to see if he can have his coumadin check, at a [facility] facility that is closer to him. Please advise

## 2024-01-16 ENCOUNTER — Ambulatory Visit: Payer: Medicare HMO | Attending: Cardiovascular Disease

## 2024-01-16 DIAGNOSIS — Z5181 Encounter for therapeutic drug level monitoring: Secondary | ICD-10-CM

## 2024-01-16 DIAGNOSIS — I480 Paroxysmal atrial fibrillation: Secondary | ICD-10-CM | POA: Diagnosis not present

## 2024-01-16 LAB — POCT INR: INR: 2.6 (ref 2.0–3.0)

## 2024-01-16 NOTE — Patient Instructions (Signed)
 Continue 1 tablet daily, except 1.5 tablets on Tuesdays AND Thursdays. Recheck INR in 6 weeks.  Please call Anticoagulation Clinic [phone]

## 2024-02-27 ENCOUNTER — Ambulatory Visit: Attending: Cardiovascular Disease

## 2024-02-27 DIAGNOSIS — Z5181 Encounter for therapeutic drug level monitoring: Secondary | ICD-10-CM

## 2024-02-27 DIAGNOSIS — I480 Paroxysmal atrial fibrillation: Secondary | ICD-10-CM

## 2024-02-27 LAB — POCT INR: INR: 3.1 — AB (ref 2.0–3.0)

## 2024-02-27 NOTE — Patient Instructions (Signed)
 Continue 1 tablet daily, except 1.5 tablets on Tuesdays AND Thursdays. Recheck INR in 6 weeks. Eat greens tonight Please call Anticoagulation Clinic [phone]

## 2024-03-07 ENCOUNTER — Other Ambulatory Visit: Payer: Self-pay | Admitting: Cardiovascular Disease

## 2024-03-07 ENCOUNTER — Other Ambulatory Visit: Payer: Self-pay

## 2024-03-07 ENCOUNTER — Telehealth: Payer: Self-pay | Admitting: Cardiovascular Disease

## 2024-03-07 DIAGNOSIS — I480 Paroxysmal atrial fibrillation: Secondary | ICD-10-CM

## 2024-03-07 MED ORDER — WARFARIN SODIUM 5 MG PO TABS: ORAL_TABLET | ORAL | 0 refills | Status: DC

## 2024-03-07 NOTE — Telephone Encounter (Signed)
 Pt c/o medication issue:  1. Name of Medication:  warfarin (COUMADIN ) 5 MG tablet Take 1 tablet (5 mg total) by mouth daily.     2. How are you currently taking this medication (dosage and times per day)?    3. Are you having a reaction (difficulty breathing--STAT)? no  4. What is your medication issue? Patient's pharmacy can't get medication till Wednesday. Patient's wants to know if he will be okay with going that long without meds. Please advise

## 2024-03-07 NOTE — Telephone Encounter (Signed)
 Patient was called and stated he spoke to someone in the office that he needs to have an appointment. Before  prescription can be filled.     Patient wanted to know if he can stop medication for 5 days until he is able to receive the warfarin   RN informed patient he can not stop medication like that.  RN reviewed patient chart. He was last seen in Dec 2024.  His prescription should be able to be filled with out problem.  I apologized to patient.  The message will be sent to Anticoag pool.

## 2024-03-07 NOTE — Telephone Encounter (Signed)
 Pt has scheduled appt with Chad, NP on 04/17/24. Appropriate dose. Refill sent.

## 2024-03-07 NOTE — Telephone Encounter (Signed)
 Prescription refill request received for warfarin Lov: 07/20/22 ([facility])  Next INR check: 04/09/24 Warfarin tablet strength: 5mg   Office visit overdue. Called pt and made him aware. Transferred call to schedulers.

## 2024-04-09 ENCOUNTER — Ambulatory Visit: Attending: Cardiovascular Disease

## 2024-04-09 DIAGNOSIS — I480 Paroxysmal atrial fibrillation: Secondary | ICD-10-CM | POA: Diagnosis not present

## 2024-04-09 DIAGNOSIS — Z5181 Encounter for therapeutic drug level monitoring: Secondary | ICD-10-CM | POA: Diagnosis not present

## 2024-04-09 LAB — POCT INR: INR: 3.1 — AB (ref 2.0–3.0)

## 2024-04-09 NOTE — Patient Instructions (Signed)
 Continue 1 tablet daily, except 1.5 tablets on Tuesdays AND Thursdays. Recheck INR in 6 weeks. Eat greens tonight Please call Anticoagulation Clinic [phone]

## 2024-04-17 ENCOUNTER — Ambulatory Visit: Admitting: Cardiology

## 2024-05-04 ENCOUNTER — Encounter (HOSPITAL_BASED_OUTPATIENT_CLINIC_OR_DEPARTMENT_OTHER): Payer: Self-pay | Admitting: Cardiovascular Disease

## 2024-05-05 NOTE — Telephone Encounter (Signed)
Routing to correct triage pool

## 2024-05-16 ENCOUNTER — Other Ambulatory Visit: Payer: Self-pay | Admitting: Cardiovascular Disease

## 2024-05-21 ENCOUNTER — Ambulatory Visit: Attending: Cardiovascular Disease

## 2024-05-21 DIAGNOSIS — I480 Paroxysmal atrial fibrillation: Secondary | ICD-10-CM | POA: Diagnosis not present

## 2024-05-21 DIAGNOSIS — Z5181 Encounter for therapeutic drug level monitoring: Secondary | ICD-10-CM | POA: Diagnosis not present

## 2024-05-21 LAB — POCT INR: INR: 3.4 — AB (ref 2.0–3.0)

## 2024-05-21 NOTE — Progress Notes (Unsigned)
 Cardiology Office Note   Date:  05/23/2024  ID:  James Nunez, DOB [birth year], MRN [id number] PCP: Charlett [name] POUR, MD  [facility] Providers Cardiologist:  Darryle ONEIDA Decent, MD {   History of Present Illness James Nunez is a 68 y.o. male with a past medical history of paroxysmal atrial fibrillation who presents for follow-up appointment.  18% of atrial fibrillation burden with minimal symptoms.  Was last seen by Dr. Decent December 2024.  He reported no recent episodes with noticeable heart racing since his last visit.  Denied chest pain or trouble breathing.  Maintains an active lifestyle with regular exercise.  Switch from Eliquis  to Coumadin  due to cost concerns.  Rest of preference for Eliquis  open Coumadin  due to the need for regular blood tests.  Consider repeat pricing Eliquis  in the new year.  Hypertension which was well-controlled on current regimen including valsartan  which had recently been doubled due to suboptimal blood pressure control.  Pulse 46 bpm.  No dizziness or lightheadedness.  Due to sinus bradycardia, diltiazem  dose was reduced to 120 mg daily.  Today, he presents with atrial fibrillation who has had  increased episodes of rapid heartbeat and skipped beats.  He experiences increased episodes of rapid heartbeat and skipped beats, described as 'flip flopping,' with six occurrences in the past month. These episodes are associated with significant fatigue. There is no current nausea, dizziness, or lightheadedness.  He was on diltiazem  240 mg, reduced to 120 mg in December due to low heart rate, after which atrial fibrillation episodes increased. He is also taking valsartan  for blood pressure management.  His last heart monitor in August 2023 showed atrial fibrillation 18% of the time. He maintains an active lifestyle, walking every morning and mowing his yard. He acknowledges that dehydration and heat may contribute to his symptoms. Hydration and electrolyte  replacement when appropriate recommended.   Reports no shortness of breath nor dyspnea on exertion. Reports no chest pain, pressure, or tightness. No edema, orthopnea, PND.   Discussed the use of AI scribe software for clinical note transcription with the patient, who gave verbal consent to proceed.   ROS: Pertinent ROS in HPI  Studies Reviewed EKG Interpretation Date/Time:  Friday May 23 2024 08:24:33 EDT Ventricular Rate:  50 PR Interval:  156 QRS Duration:  90 QT Interval:  464 QTC Calculation: 423 R Axis:   2  Text Interpretation: Sinus bradycardia When compared with ECG of 10-Oct-2023 11:14, No significant change was found Confirmed by Lucien Blanc [phone]) on 05/23/2024 8:54:40 AM     Risk Assessment/Calculations  CHA2DS2-VASc Score = 2  This indicates a 2.2% annual risk of stroke. The patient's score is based upon: CHF History: 0 HTN History: 1 Diabetes History: 0 Stroke History: 0 Vascular Disease History: 0 Age Score: 1 Gender Score: 0        Physical Exam VS:  BP (!) 173/91 (BP Location: Left Arm, Patient Position: Sitting)   Pulse (!) 50   Ht 6' 2 (1.88 m)   Wt 186 lb 6.4 oz (84.6 kg)   SpO2 98%   BMI 23.93 kg/m        Wt Readings from Last 3 Encounters:  05/23/24 186 lb 6.4 oz (84.6 kg)  10/10/23 189 lb 3.2 oz (85.8 kg)  07/20/23 186 lb (84.4 kg)    GEN: Well nourished, well developed in no acute distress NECK: No JVD; No carotid bruits CARDIAC: RRR, no murmurs, rubs, gallops RESPIRATORY:  Clear to auscultation  without rales, wheezing or rhonchi  ABDOMEN: Soft, non-tender, non-distended EXTREMITIES:  No edema; No deformity   ASSESSMENT AND PLAN   Atrial Fibrillation Increased episodes coinciding with reduced diltiazem  dosage. Possible dehydration and electrolyte imbalance due to heat exposure. Symptoms include rapid heartbeat and fatigue. - Increase diltiazem  to 180 mg daily. - Order labs BMP and mag - Advise hydration and electrolyte  intake, 64-90 ounces water daily. - Recommend one electrolyte drink daily. - Plan heart monitor placement next visit to evaluate afib burden  Bradycardia Heart rate increased to 50 bpm after diltiazem  reduction.  - Monitor heart rate at home for two weeks. - Submit log via MyChart or phone.  Hypertension Blood pressure high. Awaiting effect of increased diltiazem  before valsartan  adjustment. - Monitor blood pressure at home for two weeks. - Evaluate response to diltiazem  before valsartan  adjustment.     Dispo: He can follow-up in 6 months with Dr. Barbaraann  Signed, [name], PA-C

## 2024-05-21 NOTE — Patient Instructions (Signed)
 Hold tonight only then Continue 1 tablet daily, except 1.5 tablets on Tuesdays AND Thursdays. Recheck INR in 6 weeks. Greens 2 per week. Please call Anticoagulation Clinic [phone]

## 2024-05-23 ENCOUNTER — Encounter: Payer: Self-pay | Admitting: Physician Assistant

## 2024-05-23 ENCOUNTER — Ambulatory Visit: Attending: Physician Assistant | Admitting: Physician Assistant

## 2024-05-23 VITALS — BP 173/91 | HR 50 | Ht 74.0 in | Wt 186.4 lb

## 2024-05-23 DIAGNOSIS — I480 Paroxysmal atrial fibrillation: Secondary | ICD-10-CM

## 2024-05-23 DIAGNOSIS — R001 Bradycardia, unspecified: Secondary | ICD-10-CM

## 2024-05-23 DIAGNOSIS — I10 Essential (primary) hypertension: Secondary | ICD-10-CM

## 2024-05-23 DIAGNOSIS — R002 Palpitations: Secondary | ICD-10-CM

## 2024-05-23 MED ORDER — DILTIAZEM HCL ER COATED BEADS 180 MG PO CP24: 180.0000 mg | ORAL_CAPSULE | Freq: Every day | ORAL | 3 refills | Status: DC

## 2024-05-23 NOTE — Patient Instructions (Addendum)
 Medication Instructions:  Increase the Cardizem  120mg  to Cardizem  180mg . Take one tablet daily.  Monitor your blood pressure and heart rate daily for 2 weeks. Upload values to your mychart.   *If you need a refill on your cardiac medications before your next appointment, please call your pharmacy*   Lab Work: Labs will be drawn today.............................. BMET, MAG If you have labs (blood work) drawn today and your tests are completely normal, you will receive your results only by: MyChart Message (if you have MyChart) OR A paper copy in the mail If you have any lab test that is abnormal or we need to change your treatment, we will call you to review the results.   Testing/Procedures: No procedures were ordered during today's visit.    Follow-Up: At [facility], you and your health needs are our priority.  As part of our continuing mission to provide you with exceptional heart care, we have created designated Provider Care Teams.  These Care Teams include your primary Cardiologist (physician) and Advanced Practice Providers (APPs -  Physician Assistants and Nurse Practitioners) who all work together to provide you with the care you need, when you need it.  We recommend signing up for the patient portal called MyChart.  Sign up information is provided on this After Visit Summary.  MyChart is used to connect with patients for Virtual Visits (Telemedicine).  Patients are able to view lab/test results, encounter notes, upcoming appointments, etc.  Non-urgent messages can be sent to your provider as well.   To learn more about what you can do with MyChart, go to [email or web address].    Your next appointment:   6 month(s)  Provider:   Darryle ONEIDA Decent, MD    Other Instructions Thank you for choosing [facility]!      BLOOD PRESSURE READINGS TAKE YOUR BLOOD PRESSURE AT LEAST 1 HOUR AFTER TAKING YOUR MEDICATION BRING LOG WITH YOU TO YOUR FOLLOW UP  APPOINTMENT FOR REVIEW   DATE TIME BP HR COMMENT DATE  TIME  BP HR COMMENT  DATE TIME BP HR COMMENT DATE  TIME  BP HR COMMENT

## 2024-05-24 LAB — BASIC METABOLIC PANEL WITH GFR
BUN/Creatinine Ratio: 8 — ABNORMAL LOW (ref 10–24)
BUN: 7 mg/dL — ABNORMAL LOW (ref 8–27)
CO2: 24 mmol/L (ref 20–29)
Calcium: 8.9 mg/dL (ref 8.6–10.2)
Chloride: 103 mmol/L (ref 96–106)
Creatinine, Ser: 0.9 mg/dL (ref 0.76–1.27)
Glucose: 111 mg/dL — ABNORMAL HIGH (ref 70–99)
Potassium: 4.1 mmol/L (ref 3.5–5.2)
Sodium: 142 mmol/L (ref 134–144)
eGFR: 92 mL/min/1.73 (ref >59)

## 2024-05-24 LAB — MAGNESIUM: Magnesium: 2.2 mg/dL (ref 1.6–2.3)

## 2024-05-26 ENCOUNTER — Ambulatory Visit: Payer: Self-pay | Admitting: *Deleted

## 2024-05-26 DIAGNOSIS — Z8679 Personal history of other diseases of the circulatory system: Secondary | ICD-10-CM

## 2024-05-26 DIAGNOSIS — R002 Palpitations: Secondary | ICD-10-CM

## 2024-05-26 DIAGNOSIS — I480 Paroxysmal atrial fibrillation: Secondary | ICD-10-CM

## 2024-06-03 ENCOUNTER — Other Ambulatory Visit: Payer: Self-pay | Admitting: Internal Medicine

## 2024-06-16 NOTE — Telephone Encounter (Signed)
 I think your Bps look good on the new dose. I would still want you to have a monitor to check your Afib burden. Please let me know if we can send this to your now or you want to wait until your next appointment.   Thanks! [name], PA-C 06/16/24 12:29 PM

## 2024-06-23 ENCOUNTER — Ambulatory Visit: Attending: Physician Assistant

## 2024-06-23 DIAGNOSIS — R002 Palpitations: Secondary | ICD-10-CM

## 2024-06-23 DIAGNOSIS — Z8679 Personal history of other diseases of the circulatory system: Secondary | ICD-10-CM

## 2024-06-23 DIAGNOSIS — I480 Paroxysmal atrial fibrillation: Secondary | ICD-10-CM

## 2024-06-23 NOTE — Progress Notes (Unsigned)
 Enrolled for [employer] to mail a ZIO XT long term holter monitor to the patients address on file.   Dr. Darryle Decent to read.  Note to [employer] to contact patient with an out of pocket estimate. Patient also given printed instructions stating how to contact [employer] for an out of pocket estimate, applying for financial assistance, and/ or setting up a 12 month interest free payment plan.

## 2024-07-02 ENCOUNTER — Ambulatory Visit: Attending: Cardiovascular Disease

## 2024-07-02 DIAGNOSIS — Z5181 Encounter for therapeutic drug level monitoring: Secondary | ICD-10-CM

## 2024-07-02 DIAGNOSIS — I480 Paroxysmal atrial fibrillation: Secondary | ICD-10-CM

## 2024-07-02 LAB — POCT INR: INR: 3.1 — AB (ref 2.0–3.0)

## 2024-07-02 NOTE — Patient Instructions (Signed)
 Continue 1 tablet daily, except 1.5 tablets on Tuesdays AND Thursdays. Recheck INR in 6 weeks. Greens 2 per week. Please call Anticoagulation Clinic [phone]

## 2024-07-17 DIAGNOSIS — I4729 Other ventricular tachycardia: Secondary | ICD-10-CM | POA: Diagnosis not present

## 2024-07-17 DIAGNOSIS — I4719 Other supraventricular tachycardia: Secondary | ICD-10-CM | POA: Diagnosis not present

## 2024-07-17 DIAGNOSIS — R002 Palpitations: Secondary | ICD-10-CM | POA: Diagnosis not present

## 2024-07-17 DIAGNOSIS — Z8679 Personal history of other diseases of the circulatory system: Secondary | ICD-10-CM | POA: Diagnosis not present

## 2024-07-17 DIAGNOSIS — I4891 Unspecified atrial fibrillation: Secondary | ICD-10-CM | POA: Diagnosis not present

## 2024-07-20 DIAGNOSIS — R002 Palpitations: Secondary | ICD-10-CM

## 2024-07-20 DIAGNOSIS — I480 Paroxysmal atrial fibrillation: Secondary | ICD-10-CM | POA: Diagnosis not present

## 2024-07-20 DIAGNOSIS — Z8679 Personal history of other diseases of the circulatory system: Secondary | ICD-10-CM | POA: Diagnosis not present

## 2024-07-22 ENCOUNTER — Ambulatory Visit: Payer: Self-pay | Admitting: Physician Assistant

## 2024-07-22 ENCOUNTER — Encounter: Payer: Self-pay | Admitting: Physician Assistant

## 2024-07-23 ENCOUNTER — Encounter: Admitting: Internal Medicine

## 2024-07-23 ENCOUNTER — Ambulatory Visit (INDEPENDENT_AMBULATORY_CARE_PROVIDER_SITE_OTHER): Payer: Medicare HMO

## 2024-07-23 VITALS — Ht 74.0 in | Wt 186.0 lb

## 2024-07-23 DIAGNOSIS — Z0000 Encounter for general adult medical examination without abnormal findings: Secondary | ICD-10-CM | POA: Diagnosis not present

## 2024-07-23 DIAGNOSIS — Z1211 Encounter for screening for malignant neoplasm of colon: Secondary | ICD-10-CM | POA: Diagnosis not present

## 2024-07-23 NOTE — Patient Instructions (Addendum)
 James Nunez,  Thank you for taking the time for your Medicare Wellness Visit. I appreciate your continued commitment to your health goals. Please review the care plan we discussed, and feel free to reach out if I can assist you further.  Medicare recommends these wellness visits once per year to help you and your care team stay ahead of potential health issues. These visits are designed to focus on prevention, allowing your provider to concentrate on managing your acute and chronic conditions during your regular appointments.  Please note that Annual Wellness Visits do not include a physical exam. Some assessments may be limited, especially if the visit was conducted virtually. If needed, we may recommend a separate in-person follow-up with your provider.  Ongoing Care Seeing your primary care provider every 3 to 6 months helps us  monitor your health and provide consistent, personalized care.   Referrals If a referral was made during today's visit and you haven't received any updates within two weeks, please contact the referred provider directly to check on the status.  Recommended Screenings:  Health Maintenance  Topic Date Due   Zoster (Shingles) Vaccine (1 of 2) Never done   Colon Cancer Screening  11/06/2018   COVID-19 Vaccine (2 - Janssen risk series) 03/13/2020   Flu Shot  06/06/2024   Medicare Annual Wellness Visit  07/23/2025   Pneumococcal Vaccine for age over 19  Completed   Hepatitis C Screening  Completed   HPV Vaccine  Aged Out   Meningitis B Vaccine  Aged Out   DTaP/Tdap/Td vaccine  Discontinued       07/23/2024    8:17 AM  Advanced Directives  Does Patient Have a Medical Advance Directive? No  Would patient like information on creating a medical advance directive? No - Patient declined   Advance Care Planning is important because it: Ensures you receive medical care that aligns with your values, goals, and preferences. Provides guidance to your family and loved  ones, reducing the emotional burden of decision-making during critical moments.  Vision: Annual vision screenings are recommended for early detection of glaucoma, cataracts, and diabetic retinopathy. These exams can also reveal signs of chronic conditions such as diabetes and high blood pressure.  Dental: Annual dental screenings help detect early signs of oral cancer, gum disease, and other conditions linked to overall health, including heart disease and diabetes.  Please see the attached documents for additional preventive care recommendations.

## 2024-07-23 NOTE — Progress Notes (Signed)
 Subjective:   James Nunez is a 68 y.o. who presents for a Medicare Wellness preventive visit.  As a reminder, Annual Wellness Visits don't include a physical exam, and some assessments may be limited, especially if this visit is performed virtually. We may recommend an in-person follow-up visit with your provider if needed.  Visit Complete: Virtual I connected with  James Nunez on 07/23/24 by a audio enabled telemedicine application and verified that I am speaking with the correct person using two identifiers.  Patient Location: Home  Provider Location: Home Office  I discussed the limitations of evaluation and management by telemedicine. The patient expressed understanding and agreed to proceed.  Vital Signs: Because this visit was a virtual/telehealth visit, some criteria may be missing or patient reported. Any vitals not documented were not able to be obtained and vitals that have been documented are patient reported.    Persons Participating in Visit: Patient.  AWV Questionnaire: Yes: Patient Medicare AWV questionnaire was completed by the patient on 07/22/24; I have confirmed that all information answered by patient is correct and no changes since this date.  Cardiac Risk Factors include: advanced age (>60men, >81 women);male gender;hypertension     Objective:    Today's Vitals   07/23/24 0812  Weight: 186 lb (84.4 kg)  Height: 6' 2 (1.88 m)   Body mass index is 23.88 kg/m.     07/23/2024    8:17 AM 07/20/2023    8:31 AM 07/17/2022    8:26 AM  Advanced Directives  Does Patient Have a Medical Advance Directive? No No No  Would patient like information on creating a medical advance directive? No - Patient declined No - Patient declined No - Patient declined    Current Medications (verified) Outpatient Encounter Medications as of 07/23/2024  Medication Sig   albuterol  (VENTOLIN  HFA) 108 (90 Base) MCG/ACT inhaler INHALE 1 TO 2 PUFFS BY MOUTH EVERY 6 HOURS AS  NEEDED FOR ASTHMA   diltiazem  (CARDIZEM  CD) 180 MG 24 hr capsule Take 1 capsule (180 mg total) by mouth daily.   levothyroxine  (SYNTHROID ) 100 MCG tablet TAKE 1 TABLET BY MOUTH EVERY DAY   valsartan  (DIOVAN ) 160 MG tablet TAKE 1 TABLET (160 MG TOTAL) BY MOUTH DAILY. DOSAGE CHANGE   warfarin (COUMADIN ) 5 MG tablet TAKE 1 TABLET TO 1 1/2 TABLETS BY MOUTH DAILY AS DIRECTED BY COUMADIN  CLINIC   No facility-administered encounter medications on file as of 07/23/2024.    Allergies (verified) Simvastatin and Dyazide [hydrochlorothiazide-triamterene ]   History: Past Medical History:  Diagnosis Date   Allergic rhinitis    Asthma    ?   Atrial fibrillation, persistent (HCC)    Paroxysmal   Cardiomyopathy    related to hyperthyroid condition   Hypertension    Hypothyroidism    s/p hyperthyroid heart failure and RAI rx   Low HDL (under 40)    Palpitations 05/19/2013   Umbilical hernia    surgery consult no intervention if no sx    Past Surgical History:  Procedure Laterality Date   ABDOMINAL SURGERY     no surgical history     Family History  Problem Relation Age of Onset   Stroke Mother    Diabetes Mother    Stomach cancer Father    Diabetes Other    Asthma Other        sibs   Alcohol abuse Other        sib   Social History   Socioeconomic History  Marital status: Married    Spouse name: Not on file   Number of children: 2   Years of education: Not on file   Highest education level: 12th grade  Occupational History   Occupation: Truck Air traffic controller: [employer]     Comment: [employer]    Occupation: Retired  Tobacco Use   Smoking status: Never   Smokeless tobacco: Never  Vaping Use   Vaping status: Never Used  Substance and Sexual Activity   Alcohol use: No   Drug use: No   Sexual activity: Not on file  Other Topics Concern   Not on file  Social History Narrative   Married, Ex athlete.  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 Long Hours. 50 hours per week.   Does physical loading.  [employer]    Social Drivers of Corporate investment banker Strain: Low Risk  (07/23/2024)   Overall Financial Resource Strain (CARDIA)    Difficulty of Paying Living Expenses: Not hard at all  Food Insecurity: No Food Insecurity (07/23/2024)   Hunger Vital Sign    Worried About Running Out of Food in the Last Year: Never true    Ran Out of Food in the Last Year: Never true  Transportation Needs: No Transportation Needs (07/23/2024)   PRAPARE - Administrator, Civil Service (Medical): No    Lack of Transportation (Non-Medical): No  Physical Activity: Sufficiently Active (07/23/2024)   Exercise Vital Sign    Days of Exercise per Week: 5 days    Minutes of Exercise per Session: 30 min  Stress: No Stress Concern Present (07/23/2024)   [employer] of Occupational Health - Occupational Stress Questionnaire    Feeling of Stress: Not at all  Social Connections: Socially Integrated (07/23/2024)   Social Connection and Isolation Panel    Frequency of Communication with Friends and Family: More than three times a week    Frequency of Social Gatherings with Friends and Family: More than three times a week    Attends Religious Services: 1 to 4 times per year    Active Member of [employer] or Organizations: Yes    Attends Banker Meetings: 1 to 4 times per year    Marital Status: Married    Tobacco Counseling Counseling given: Not Answered    Clinical Intake:  Pre-visit preparation completed: Yes  Pain : No/denies pain     BMI - recorded: 23.88 Nutritional Status: BMI of 19-24  Normal Nutritional Risks: None Diabetes: No  Lab Results  Component Value Date   HGBA1C 6.3 06/26/2023   HGBA1C 6.2 06/21/2022   HGBA1C 6.0 06/20/2021     How often do you need to have someone help you when you read instructions, pamphlets, or other written materials  from your doctor or pharmacy?: 1 - Never  Interpreter Needed?: No  Information entered by :: Rojelio Blush LPN   Activities of Daily Living     07/23/2024    8:16 AM 07/22/2024   10:07 AM  In your present state of health, do you have any difficulty performing the following activities:  Hearing? 0 0  Vision? 0 0  Difficulty concentrating or making decisions? 0 0  Walking or climbing stairs? 0 0  Dressing or bathing? 0 0  Doing errands, shopping? 0 0  Preparing Food and eating ? N N  Using  the Toilet? N N  In the past six months, have you accidently leaked urine? N N  Do you have problems with loss of bowel control? N N  Managing your Medications? N N  Managing your Finances? N N  Housekeeping or managing your Housekeeping? N N    Patient Care Team: Charlett [name] POUR, MD as PCP - General O'Neal, Darryle Ned, MD as PCP - Cardiology (Cardiology) Anner Alm ORN, MD as Consulting Physician (Cardiology)  I have updated your Care Teams any recent Medical Services you may have received from other providers in the past year.     Assessment:   This is a routine wellness examination for James Nunez.  Hearing/Vision screen Hearing Screening - Comments:: Denies hearing difficulties   Vision Screening - Comments:: Wears rx glasses - up to date with routine eye exams with  [facility]   Goals Addressed               This Visit's Progress     Increase physical activity (pt-stated)        Remain active and travel more.       Depression Screen     07/23/2024    8:18 AM 07/20/2023    8:29 AM 06/26/2023    9:37 AM 07/17/2022    8:24 AM 06/21/2022   10:09 AM 06/20/2021   10:17 AM 11/10/2019    3:49 PM  PHQ 2/9 Scores  PHQ - 2 Score 0 0 0 0 0 0 0  PHQ- 9 Score  0 0 0 0 0     Fall Risk     07/23/2024    8:16 AM 07/22/2024   10:07 AM 07/20/2023    8:30 AM 06/26/2023    9:37 AM 07/17/2022    8:26 AM  Fall Risk   Falls in the past year? 0 0 0 0 0  Number falls in past yr: 0  0 0 0   Injury with Fall? 0 0 0 0 0  Risk for fall due to : No Fall Risks  No Fall Risks No Fall Risks No Fall Risks  Follow up Falls evaluation completed  Falls prevention discussed Falls evaluation completed Falls prevention discussed      Data saved with a previous flowsheet row definition    MEDICARE RISK AT HOME:  Medicare Risk at Home Any stairs in or around the home?: Yes If so, are there any without handrails?: No Home free of loose throw rugs in walkways, pet beds, electrical cords, etc?: Yes Adequate lighting in your home to reduce risk of falls?: Yes Life alert?: No Use of a cane, walker or w/c?: No Grab bars in the bathroom?: No Shower chair or bench in shower?: No Elevated toilet seat or a handicapped toilet?: No  TIMED UP AND GO:  Was the test performed?  No  Cognitive Function: 6CIT completed        07/23/2024    8:17 AM 07/20/2023    8:32 AM 07/17/2022    8:27 AM  6CIT Screen  What Year? 0 points 0 points 0 points  What month? 0 points 0 points 0 points  What time? 0 points 0 points 0 points  Count back from 20 0 points 0 points 0 points  Months in reverse 0 points 0 points 0 points  Repeat phrase 0 points 0 points 0 points  Total Score 0 points 0 points 0 points    Immunizations Immunization History  Administered Date(s) Administered   Fluad  Quad(high Dose 65+) 11/10/2021, 08/09/2022   Influenza Split 11/04/2012   Influenza,inj,Quad PF,6+ Mos 12/08/2013, 08/28/2016   Janssen (J&J) SARS-COV-2 Vaccination 02/14/2020   PNEUMOCOCCAL CONJUGATE-20 06/26/2023   Pneumococcal Conjugate-13 11/10/2019   Pneumococcal Polysaccharide-23 03/29/2010   Td 11/06/1996    Screening Tests Health Maintenance  Topic Date Due   Zoster Vaccines- Shingrix (1 of 2) Never done   Colonoscopy  11/06/2018   COVID-19 Vaccine (2 - Janssen risk series) 03/13/2020   Influenza Vaccine  06/06/2024   Medicare Annual Wellness (AWV)  07/23/2025   Pneumococcal Vaccine: 50+ Years   Completed   Hepatitis C Screening  Completed   HPV VACCINES  Aged Out   Meningococcal B Vaccine  Aged Out   DTaP/Tdap/Td  Discontinued    Health Maintenance Items Addressed: Referral sent to GI for colonoscopy  Additional Screening:  Vision Screening: Recommended annual ophthalmology exams for early detection of glaucoma and other disorders of the eye. Is the patient up to date with their annual eye exam?  Yes  Who is the provider or what is the name of the office in which the patient attends annual eye exams? [facility]  Dental Screening: Recommended annual dental exams for proper oral hygiene  Community Resource Referral / Chronic Care Management: CRR required this visit?  No   CCM required this visit?  No   Plan:    I have personally reviewed and noted the following in the patient's chart:   Medical and social history Use of alcohol, tobacco or illicit drugs  Current medications and supplements including opioid prescriptions. Patient is not currently taking opioid prescriptions. Functional ability and status Nutritional status Physical activity Advanced directives List of other physicians Hospitalizations, surgeries, and ER visits in previous 12 months Vitals Screenings to include cognitive, depression, and falls Referrals and appointments  In addition, I have reviewed and discussed with patient certain preventive protocols, quality metrics, and best practice recommendations. A written personalized care plan for preventive services as well as general preventive health recommendations were provided to patient.   Rojelio LELON Blush, LPN   [id number]   After Visit Summary: (MyChart) Due to this being a telephonic visit, the after visit summary with patients personalized plan was offered to patient via MyChart   Notes: Nothing significant to report at this time.

## 2024-08-05 ENCOUNTER — Encounter: Admitting: Internal Medicine

## 2024-08-13 ENCOUNTER — Ambulatory Visit: Attending: Cardiovascular Disease

## 2024-08-13 DIAGNOSIS — I480 Paroxysmal atrial fibrillation: Secondary | ICD-10-CM | POA: Diagnosis not present

## 2024-08-13 DIAGNOSIS — Z5181 Encounter for therapeutic drug level monitoring: Secondary | ICD-10-CM | POA: Diagnosis not present

## 2024-08-13 LAB — POCT INR: INR: 2 (ref 2.0–3.0)

## 2024-08-13 NOTE — Patient Instructions (Signed)
 Continue 1 tablet daily, except 1.5 tablets on Tuesdays AND Thursdays. Recheck INR in 6 weeks. Greens 2 per week. Please call Anticoagulation Clinic [phone]

## 2024-08-24 NOTE — Progress Notes (Unsigned)
 Cardiology Office Note   Date:  08/25/2024  ID:  James, Nunez [birth year], MRN [id number] PCP: Charlett [name] POUR, MD  [facility] Providers Cardiologist:  James ONEIDA Decent, MD {   History of Present Illness James Nunez is a 68 y.o. male with a past medical history of paroxysmal atrial fibrillation who presents for follow-up appointment.  18% of atrial fibrillation burden with minimal symptoms.  Was last seen by Dr. Decent December 2024.  He reported no recent episodes with noticeable heart racing since his last visit.  Denied chest pain or trouble breathing.  Maintains an active lifestyle with regular exercise.  Switch from Eliquis  to Coumadin  due to cost concerns.  Rest of preference for Eliquis  open Coumadin  due to the need for regular blood tests.  Consider repeat pricing Eliquis  in the new year.  Hypertension which was well-controlled on current regimen including valsartan  which had recently been doubled due to suboptimal blood pressure control.  Pulse 46 bpm.  No dizziness or lightheadedness.  Due to sinus bradycardia, diltiazem  dose was reduced to 120 mg daily.  He was last seen 05/2024, he presents with atrial fibrillation who has had  increased episodes of rapid heartbeat and skipped beats.  He experiences increased episodes of rapid heartbeat and skipped beats, described as 'flip flopping,' with six occurrences in the past month. These episodes are associated with significant fatigue. There is no current nausea, dizziness, or lightheadedness.  He was on diltiazem  240 mg, reduced to 120 mg in December due to low heart rate, after which atrial fibrillation episodes increased. He is also taking valsartan  for blood pressure management.  His last heart monitor in August 2023 showed atrial fibrillation 18% of the time. He maintains an active lifestyle, walking every morning and mowing his yard. He acknowledges that dehydration and heat may contribute to his symptoms.  Hydration and electrolyte replacement when appropriate recommended.   Reports no shortness of breath nor dyspnea on exertion. Reports no chest pain, pressure, or tightness. No edema, orthopnea, PND.   Discussed the use of AI scribe software for clinical note transcription with the patient, who gave verbal consent to proceed.  Today, he presents in atrial fibrillation with recurrent episodes of atrial fibrillation. He was referred by his primary care physician, [name], due to inconsistent episodes of atrial fibrillation.  Since July, he has experienced three episodes of atrial fibrillation, a reduction from six episodes prior to his last visit (in a months time). During these episodes, his heart rate increases and his blood pressure rises. His diltiazem  dosage was adjusted from 240 mg to 120 mg due to low heart rate and then increased to 180 mg, with some improvement noted but continued episodes.  He has no bleeding complications with Coumadin . A 14-day monitor showed episodes of atrial fibrillation and supraventricular tachycardia (SVT). During SVT episodes, he experiences fatigue and mild headaches, which are short-lived.  He has a history of thyroidectomy and is on a stable dose of thyroid  medication. He engages in regular physical activity, walking 30 minutes a day, five days a week, and avoids alcohol and caffeine. His blood pressure is generally well-controlled on Diovan  160 mg, though it increases during episodes of atrial fibrillation.  Reports no shortness of breath nor dyspnea on exertion. Reports no chest pain, pressure, or tightness. No edema, orthopnea, PND.   Discussed the use of AI scribe software for clinical note transcription with the patient, who gave verbal consent to proceed.   ROS: Pertinent ROS  in HPI  Studies Reviewed   Patch Wear Time:  12 days and 18 hours (2025-08-22T15:19:25-0400 to 2025-09-04T09:38:40-0400)   Patient had a min HR of 36 bpm (sinus bradycardia), max  HR of 261 bpm (4 beats of non-sustained ventricular tachycardia, 1.3 second duration), and avg HR of 61 bpm (normal sinus rhythm). Predominant underlying rhythm was Sinus Rhythm. 2 Ventricular Tachycardia runs occurred, the run with the fastest interval lasting 4 beats (1.3 second duration) with a max rate of 261 bpm, the longest lasting 4 beats with an avg rate of 195 bpm. 256 Supraventricular Tachycardia runs occurred, the run with the fastest interval lasting 8 beats (2.8 second duration) with a max rate of 231 bpm, the longest lasting 11.5 secs with an avg rate of 125 bpm. Some episodes of Supraventricular Tachycardia conducted with possible aberrancy. Atrial Fibrillation occurred (20% burden), ranging from 49-232 bpm (avg of 100 bpm), the longest lasting 1 day 0 hours with an avg rate of 106 bpm. Supraventricular Tachycardia and Atrial Fibrillation were detected within +/- 45 seconds of symptomatic patient event(s). Isolated SVEs were rare (<1.0%), SVE Couplets were rare (<1.0%), and SVE Triplets were rare (<1.0%). Isolated VEs were rare (<1.0%), VE Couplets were rare (<1.0%), and no VE Triplets were present. Ventricular Bigeminy and Trigeminy were present.    Impression: Paroxysmal Atrial fibrillation with 20% burden. Average HR in Afib, 100 bpm.  Frequent supraventricular tachycardia detected (256 episodes in 13 days; longest duration 11.5 seconds).  2 episodes of non-sustained ventricular tachycardia detected (4 beat duration).  Rare ectopy.    Signed, James DASEN. Barbaraann, MD, [facility]  [facility]  [address] [place], [place] [address] [phone]  7:38 PM  EKG Interpretation Date/Time:  Monday August 25 2024 14:48:52 EDT Ventricular Rate:  88 PR Interval:    QRS Duration:  92 QT Interval:  362 QTC Calculation: 438 R Axis:   3  Text Interpretation: Atrial fibrillation with premature ventricular or aberrantly conducted complexes When compared with ECG of 23-May-2024  08:24, Atrial fibrillation has replaced Sinus rhythm Vent. rate has increased BY  38 BPM Confirmed by Lucien Blanc [id number]) on 08/25/2024 4:16:52 PM     Risk Assessment/Calculations  CHA2DS2-VASc Score = 2  This indicates a 2.2% annual risk of stroke. The patient's score is based upon: CHF History: 0 HTN History: 1 Diabetes History: 0 Stroke History: 0 Vascular Disease History: 0 Age Score: 1 Gender Score: 0        Physical Exam VS:  BP (!) 148/88 (BP Location: Left Arm, Patient Position: Sitting, Cuff Size: Normal)   Pulse 82   Ht 6' 2 (1.88 m)   Wt 180 lb (81.6 kg)   SpO2 95%   BMI 23.11 kg/m        Wt Readings from Last 3 Encounters:  08/25/24 180 lb (81.6 kg)  07/23/24 186 lb (84.4 kg)  05/23/24 186 lb 6.4 oz (84.6 kg)    GEN: Well nourished, well developed in no acute distress NECK: No JVD; No carotid bruits CARDIAC: IRIR, no murmurs, rubs, gallops RESPIRATORY:  Clear to auscultation without rales, wheezing or rhonchi  ABDOMEN: Soft, non-tender, non-distended EXTREMITIES:  No edema; No deformity   ASSESSMENT AND PLAN   Paroxysmal atrial fibrillation and supraventricular tachycardia Episodes of AFib reduced; current heart rate 82 bpm. Diltiazem  180 mg showed improvement. SVT episodes persist with fatigue and mild headaches. - Monitor heart rate and blood pressure regularly to prevent bradycardia. - Ensure adequate hydration and electrolyte  intake.  Essential hypertension Blood pressure slightly elevated today, generally controlled with Diovan  160 mg. Increases during AFib episodes. - Continue Diovan  160 mg daily. - Monitor blood pressure regularly.  Bradycardia Monitor closely at home for 2 weeks and send me those values.      Dispo: He can follow-up in 6 months with Dr. Barbaraann  Signed, [name], PA-C

## 2024-08-25 ENCOUNTER — Ambulatory Visit: Attending: Physician Assistant | Admitting: Physician Assistant

## 2024-08-25 VITALS — BP 148/88 | HR 82 | Ht 74.0 in | Wt 180.0 lb

## 2024-08-25 DIAGNOSIS — R001 Bradycardia, unspecified: Secondary | ICD-10-CM | POA: Diagnosis not present

## 2024-08-25 DIAGNOSIS — I10 Essential (primary) hypertension: Secondary | ICD-10-CM

## 2024-08-25 DIAGNOSIS — R002 Palpitations: Secondary | ICD-10-CM | POA: Diagnosis not present

## 2024-08-25 DIAGNOSIS — I480 Paroxysmal atrial fibrillation: Secondary | ICD-10-CM

## 2024-08-25 MED ORDER — DILTIAZEM HCL ER COATED BEADS 240 MG PO CP24: 240.0000 mg | ORAL_CAPSULE | Freq: Every day | ORAL | 1 refills | Status: AC

## 2024-08-25 NOTE — Patient Instructions (Addendum)
 Medication Instructions:  INCREASE Diltiazem  to 240mg  Take 1 tablet once a day *If you need a refill on your cardiac medications before your next appointment, please call your pharmacy*  Lab Work: None ordered If you have labs (blood work) drawn today and your tests are completely normal, you will receive your results only by: MyChart Message (if you have MyChart) OR A paper copy in the mail If you have any lab test that is abnormal or we need to change your treatment, we will call you to review the results.  Testing/Procedures: None ordered  Follow-Up: At [facility], you and your health needs are our priority.  As part of our continuing mission to provide you with exceptional heart care, our providers are all part of one team.  This team includes your primary Cardiologist (physician) and Advanced Practice Providers or APPs (Physician Assistants and Nurse Practitioners) who all work together to provide you with the care you need, when you need it.  Your next appointment:   6 month(s)  Provider:   Darryle ONEIDA Decent, MD    We recommend signing up for the patient portal called MyChart.  Sign up information is provided on this After Visit Summary.  MyChart is used to connect with patients for Virtual Visits (Telemedicine).  Patients are able to view lab/test results, encounter notes, upcoming appointments, etc.  Non-urgent messages can be sent to your provider as well.   To learn more about what you can do with MyChart, go to [email or web address].   Other Instructions  Please check your blood pressure daily for two weeks, then contact the office with your readings  Please contact the office with your readings either by phone, by dropping it off in person, or by sending it through MyChart.   Be sure to check your blood pressure two hours after taking your medications.  Avoid the following for 30 minutes before checking your blood pressure: No caffeine No alcohol No  eating No smoking  No exercise  Five minutes before checking your blood pressure: Use the restroom Sit up straight in a chair with your back supported and feet flat on the floor Remain quiet and do not talk

## 2024-08-26 ENCOUNTER — Ambulatory Visit: Payer: Self-pay | Admitting: Internal Medicine

## 2024-08-26 ENCOUNTER — Encounter: Payer: Self-pay | Admitting: Internal Medicine

## 2024-08-26 ENCOUNTER — Ambulatory Visit: Admitting: Internal Medicine

## 2024-08-26 VITALS — BP 144/86 | HR 79 | Temp 97.9°F | Ht 72.64 in | Wt 179.2 lb

## 2024-08-26 DIAGNOSIS — I480 Paroxysmal atrial fibrillation: Secondary | ICD-10-CM

## 2024-08-26 DIAGNOSIS — E039 Hypothyroidism, unspecified: Secondary | ICD-10-CM

## 2024-08-26 DIAGNOSIS — Z0000 Encounter for general adult medical examination without abnormal findings: Secondary | ICD-10-CM | POA: Diagnosis not present

## 2024-08-26 DIAGNOSIS — R202 Paresthesia of skin: Secondary | ICD-10-CM

## 2024-08-26 DIAGNOSIS — Z2821 Immunization not carried out because of patient refusal: Secondary | ICD-10-CM

## 2024-08-26 DIAGNOSIS — Z125 Encounter for screening for malignant neoplasm of prostate: Secondary | ICD-10-CM

## 2024-08-26 DIAGNOSIS — E786 Lipoprotein deficiency: Secondary | ICD-10-CM

## 2024-08-26 DIAGNOSIS — R7301 Impaired fasting glucose: Secondary | ICD-10-CM

## 2024-08-26 DIAGNOSIS — Z79899 Other long term (current) drug therapy: Secondary | ICD-10-CM | POA: Diagnosis not present

## 2024-08-26 DIAGNOSIS — I10 Essential (primary) hypertension: Secondary | ICD-10-CM

## 2024-08-26 DIAGNOSIS — E876 Hypokalemia: Secondary | ICD-10-CM

## 2024-08-26 LAB — COMPREHENSIVE METABOLIC PANEL WITH GFR
ALT: 14 U/L (ref 0–53)
AST: 17 U/L (ref 0–37)
Albumin: 4.1 g/dL (ref 3.5–5.2)
Alkaline Phosphatase: 89 U/L (ref 39–117)
BUN: 8 mg/dL (ref 6–23)
CO2: 30 meq/L (ref 19–32)
Calcium: 9 mg/dL (ref 8.4–10.5)
Chloride: 100 meq/L (ref 96–112)
Creatinine, Ser: 0.93 mg/dL (ref 0.40–1.50)
GFR: 83.36 mL/min (ref >60.00)
Glucose, Bld: 117 mg/dL — ABNORMAL HIGH (ref 70–99)
Potassium: 3.4 meq/L — ABNORMAL LOW (ref 3.5–5.1)
Sodium: 140 meq/L (ref 135–145)
Total Bilirubin: 0.6 mg/dL (ref 0.2–1.2)
Total Protein: 8.3 g/dL (ref 6.0–8.3)

## 2024-08-26 LAB — LIPID PANEL
Cholesterol: 153 mg/dL (ref 0–200)
HDL: 30.6 mg/dL — ABNORMAL LOW (ref >39.00)
LDL Cholesterol: 102 mg/dL — ABNORMAL HIGH (ref 0–99)
NonHDL: 121.96
Total CHOL/HDL Ratio: 5
Triglycerides: 101 mg/dL (ref 0.0–149.0)
VLDL: 20.2 mg/dL (ref 0.0–40.0)

## 2024-08-26 LAB — CBC WITH DIFFERENTIAL/PLATELET
Basophils Absolute: 0 K/uL (ref 0.0–0.1)
Basophils Relative: 0.7 % (ref 0.0–3.0)
Eosinophils Absolute: 0 K/uL (ref 0.0–0.7)
Eosinophils Relative: 0.7 % (ref 0.0–5.0)
HCT: 47.2 % (ref 39.0–52.0)
Hemoglobin: 15.7 g/dL (ref 13.0–17.0)
Lymphocytes Relative: 27.4 % (ref 12.0–46.0)
Lymphs Abs: 1.7 K/uL (ref 0.7–4.0)
MCHC: 33.2 g/dL (ref 30.0–36.0)
MCV: 87 fl (ref 78.0–100.0)
Monocytes Absolute: 0.7 K/uL (ref 0.1–1.0)
Monocytes Relative: 10.7 % (ref 3.0–12.0)
Neutro Abs: 3.8 K/uL (ref 1.4–7.7)
Neutrophils Relative %: 60.5 % (ref 43.0–77.0)
Platelets: 205 K/uL (ref 150.0–400.0)
RBC: 5.43 Mil/uL (ref 4.22–5.81)
RDW: 14 % (ref 11.5–15.5)
WBC: 6.3 K/uL (ref 4.0–10.5)

## 2024-08-26 LAB — VITAMIN B12: Vitamin B-12: 1048 pg/mL — ABNORMAL HIGH (ref 211–911)

## 2024-08-26 LAB — TSH: TSH: 1.08 u[IU]/mL (ref 0.35–5.50)

## 2024-08-26 LAB — PSA: PSA: 0.67 ng/mL (ref 0.10–4.00)

## 2024-08-26 LAB — HEMOGLOBIN A1C: Hgb A1c MFr Bld: 6.3 % (ref 4.6–6.5)

## 2024-08-26 NOTE — Patient Instructions (Addendum)
 Good to see you today   Checking lab to include blood sugar  thyroid  and vit b12 ( should be ok on supplement) . Bp  control  below 140/90 as you report .   Call  number for colonoscopy appt.

## 2024-08-26 NOTE — Progress Notes (Signed)
 Chief Complaint  Patient presents with   Annual Exam    HPI: Patient  James Nunez  68 y.o. comes in today for Preventive Health Care visit  And Chronic disease management   HT  says reading about 130/70s at  home  Thyroid  no change HLD didn't tolerated some statins in past Hyperglycemia  fu  drinks koolade  Hx of paf   20 % burden seen card yesterday  diltiazem  incrased  to 240  had  rapid hb  in past 6 mos .  No bleeding  taking coumadin  monitored  Had some tingling in arms and legs about 3 mos ago and began taking vit b12 supplements   and   symptoms resolved so has continued with this feeling better  Resp  rarely needs inhaler  perhaps after mowing   wears mask  to prevent  Health Maintenance  Topic Date Due   Colonoscopy  11/06/2018   COVID-19 Vaccine (2 - Janssen risk series) 09/11/2024 (Originally 03/13/2020)   Zoster Vaccines- Shingrix (1 of 2) 11/26/2024 (Originally 06/05/1975)   Influenza Vaccine  02/03/2025 (Originally 06/06/2024)   Medicare Annual Wellness (AWV)  07/23/2025   Pneumococcal Vaccine: 50+ Years  Completed   Hepatitis C Screening  Completed   Meningococcal B Vaccine  Aged Out   DTaP/Tdap/Td  Discontinued   Health Maintenance Review LIFESTYLE:  Exercise:   30 min per day and resistance training  Tobacco/ETS: n Alcohol:   n Sugar beverages:  kool aid    2 x per day .  Sleep: 6 hours   ocass benadryl Drug use: no  HH of   2  no pets  Vit b12  added  sensation    ROS:  GEN/ HEENT: No fever, significant weight changes sweats headaches vision problems hearing changes, CV/ PULM; No chest pain shortness of breath cough, syncope,edema  change in exercise tolerance. GI /GU: No adominal pain, vomiting, change in bowel habits. No blood in the stool. No significant GU symptoms. SKIN/HEME: ,no acute skin rashes suspicious lesions or bleeding. No lymphadenopathy, nodules, masses.  NEURO/ PSYCH:  No neurologic signs such as weakness numbness. No depression  anxiety. IMM/ Allergy: No unusual infections.  Allergy .   REST of 12 system review negative except as per HPI   Past Medical History:  Diagnosis Date   Allergic rhinitis    Asthma    ?   Atrial fibrillation, persistent (HCC)    Paroxysmal   Cardiomyopathy    related to hyperthyroid condition   Hypertension    Hypothyroidism    s/p hyperthyroid heart failure and RAI rx   Low HDL (under 40)    Palpitations 05/19/2013   Umbilical hernia    surgery consult no intervention if no sx     Past Surgical History:  Procedure Laterality Date   ABDOMINAL SURGERY     no surgical history      Family History  Problem Relation Age of Onset   Stroke Mother    Diabetes Mother    Stomach cancer Father    Diabetes Other    Asthma Other        sibs   Alcohol abuse Other        sib    Social History   Socioeconomic History   Marital status: Married    Spouse name: Not on file   Number of children: 2   Years of education: Not on file   Highest education level: Some college, no degree  Occupational History   Occupation: Truck Air traffic controller: [employer]     Comment: [employer]   Occupation: Retired  Tobacco Use   Smoking status: Never   Smokeless tobacco: Never  Vaping Use   Vaping status: Never Used  Substance and Sexual Activity   Alcohol use: No   Drug use: No   Sexual activity: Not on file  Other Topics Concern   Not on file  Social History Narrative   Married, 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 Long Hours. 50 hours per week.   Does physical loading.  [employer]    Social Drivers of Corporate investment banker Strain: Low Risk  (08/22/2024)   Overall Financial Resource Strain (CARDIA)    Difficulty of Paying Living Expenses: Not hard at all  Food Insecurity: No Food Insecurity (08/22/2024)   Hunger Vital Sign    Worried About Running Out of Food in the Last  Year: Never true    Ran Out of Food in the Last Year: Never true  Transportation Needs: No Transportation Needs (08/22/2024)   PRAPARE - Administrator, Civil Service (Medical): No    Lack of Transportation (Non-Medical): No  Physical Activity: Sufficiently Active (08/22/2024)   Exercise Vital Sign    Days of Exercise per Week: 5 days    Minutes of Exercise per Session: 30 min  Stress: No Stress Concern Present (08/22/2024)   [employer] of Occupational Health - Occupational Stress Questionnaire    Feeling of Stress: Not at all  Social Connections: Socially Integrated (08/22/2024)   Social Connection and Isolation Panel    Frequency of Communication with Friends and Family: More than three times a week    Frequency of Social Gatherings with Friends and Family: More than three times a week    Attends Religious Services: 1 to 4 times per year    Active Member of [employer] or Organizations: Yes    Attends Banker Meetings: 1 to 4 times per year    Marital Status: Married    Outpatient Medications Prior to Visit  Medication Sig Dispense Refill   albuterol  (VENTOLIN  HFA) 108 (90 Base) MCG/ACT inhaler INHALE 1 TO 2 PUFFS BY MOUTH EVERY 6 HOURS AS NEEDED FOR ASTHMA 18 g 3   diltiazem  (CARDIZEM  CD) 240 MG 24 hr capsule Take 1 capsule (240 mg total) by mouth daily. 90 capsule 1   levothyroxine  (SYNTHROID ) 100 MCG tablet TAKE 1 TABLET BY MOUTH EVERY DAY 90 tablet 3   valsartan  (DIOVAN ) 160 MG tablet TAKE 1 TABLET (160 MG TOTAL) BY MOUTH DAILY. DOSAGE CHANGE 90 tablet 3   warfarin (COUMADIN ) 5 MG tablet TAKE 1 TABLET TO 1 1/2 TABLETS BY MOUTH DAILY AS DIRECTED BY COUMADIN  CLINIC 100 tablet 0   No facility-administered medications prior to visit.     EXAM:  BP (!) 144/86 (BP Location: Right Arm, Patient Position: Sitting, Cuff Size: Normal)   Pulse 79   Temp 97.9 F (36.6 C) (Oral)   Ht 6' 0.64 (1.845 m)   Wt 179 lb 3.2 oz (81.3 kg)   SpO2 96%   BMI 23.88  kg/m   Body mass index is 23.88 kg/m. Wt Readings from Last 3 Encounters:  08/26/24 179 lb 3.2 oz (81.3 kg)  08/25/24 180 lb (81.6 kg)  07/23/24 186 lb (84.4 kg)  Physical Exam: Vital signs reviewed HZW:Uypd is a well-developed well-nourished alert cooperative    who appearsr stated age in no acute distress.  HEENT: normocephalic atraumatic , Eyes: PERRL EOM's full, conjunctiva clear, Nares: paten,t no deformity discharge or tenderness., Ears: no deformity EAC's clear TMs with normal landmarks 3+ wax . Mouth: clear OP, no lesions, edema.  Moist mucous membranes. Dentition in adequate repair. Partial  NECK: supple without masses, thyromegaly or bruits. CHEST/PULM:  Clear to auscultation and percussion breath sounds equal no wheeze , rales or rhonchi. No chest wall deformities or tenderness. CV: PMI is nondisplaced, S1 S2 no gallops, murmurs, rubs. RR sitting  when laying some irreg nl rate  Peripheral pulses are full without delay.No JVD .  ABDOMEN: Bowel sounds normal nontender  No guard or rebound, no hepato splenomegal no CVA tenderness.  Small periumbilical hernia Extremtities:  No clubbing cyanosis or edema, no acute joint swelling or redness no focal atrophy NEURO:  Oriented x3, cranial nerves 3-12 appear to be intact, no obvious focal weakness,gait within normal limits no abnormal reflexes or asymmetrical SKIN: No acute rashes normal turgor, color, no bruising or petechiae. PSYCH: Oriented, good eye contact, no obvious depression anxiety, cognition and judgment appear normal. LN: no cervical axillary iadenopathy  Lab Results  Component Value Date   WBC 6.3 08/26/2024   HGB 15.7 08/26/2024   HCT 47.2 08/26/2024   PLT 205.0 08/26/2024   GLUCOSE 117 (H) 08/26/2024   CHOL 153 08/26/2024   TRIG 101.0 08/26/2024   HDL 30.60 (L) 08/26/2024   LDLCALC 102 (H) 08/26/2024   ALT 14 08/26/2024   AST 17 08/26/2024   NA 140 08/26/2024   K 3.4 (L) 08/26/2024   CL 100 08/26/2024    CREATININE 0.93 08/26/2024   BUN 8 08/26/2024   CO2 30 08/26/2024   TSH 1.08 08/26/2024   PSA 0.67 08/26/2024   INR 2.0 08/13/2024   HGBA1C 6.3 08/26/2024    BP Readings from Last 3 Encounters:  08/26/24 (!) 144/86  08/25/24 (!) 148/88  05/23/24 (!) 173/91   Lab Results  Component Value Date   VITAMINB12 1,048 (H) 08/26/2024    Lab plan  reviewed with patient  update  is fasting today  ASSESSMENT AND PLAN:  Discussed the following assessment and plan:    ICD-10-CM   1. Visit for preventive health examination  Z00.00     2. Medication management  Z79.899 CBC with Differential/Platelet    Comprehensive metabolic panel with GFR    Lipid panel    PSA    TSH    Hemoglobin A1c    Vitamin B12    3. Essential hypertension  I10 CBC with Differential/Platelet    Comprehensive metabolic panel with GFR    Lipid panel    PSA    TSH    Hemoglobin A1c    4. Hypothyroidism, unspecified type  E03.9 CBC with Differential/Platelet    Comprehensive metabolic panel with GFR    Lipid panel    PSA    TSH    Hemoglobin A1c    5. Fasting hyperglycemia  R73.01 CBC with Differential/Platelet    Comprehensive metabolic panel with GFR    Lipid panel    PSA    TSH    Hemoglobin A1c    6. Low HDL (under 40)  E78.6 CBC with Differential/Platelet    Comprehensive metabolic panel with GFR    Lipid panel    PSA    TSH  Hemoglobin A1c    Vitamin B12    7. PAF (paroxysmal atrial fibrillation) (HCC)  I48.0 CBC with Differential/Platelet    Comprehensive metabolic panel with GFR    Lipid panel    PSA    TSH    Hemoglobin A1c    8. Screening PSA (prostate specific antigen)  Z12.5 PSA    9. Paresthesia  R20.2 Vitamin B12    10. Influenza vaccination declined by patient  Z28.21     Update labs  ok to stay o vit b12  Limit simple sugars    ( ie koolade)  Monitor bp to ensure at goal reports mid 130s / 70 at home  Diltiazem  dosage just raised  to 240 and may help with control   Colon order  to call  number given Can try albuterol  before mowings ; have pharmacy send request if needs refill  Return in about 1 year (around 08/26/2025) for depending on results.  Patient Care Team: Charlett [name] POUR, MD as PCP - General O'Neal, Darryle Ned, MD as PCP - Cardiology (Cardiology) Anner Alm ORN, MD as Consulting Physician (Cardiology) O'Neal, Darryle Ned, MD as Consulting Physician (Cardiology) Patient Instructions  Good to see you today   Checking lab to include blood sugar  thyroid  and vit b12 ( should be ok on supplement) . Bp  control  below 140/90 as you report .   Call  number for colonoscopy appt.   Karli Wickizer K. Hansford Hirt M.D.

## 2024-08-26 NOTE — Progress Notes (Signed)
 Blood sugar in pre diabetes range  advise restrict simple sugars such as the koolade  Thyroid  in range  Kidney liver ok  Cholesterol about the same  optimize exercise and diet. Potassium is borderline low again and not sure why.   Increase potassium in diet as you did before .   Check potassium  level in  3-5 weeks to make sure ok.  B12 level is high   you might want to decrease dosing to 3 days per week or  decrease daily dose

## 2024-08-27 ENCOUNTER — Other Ambulatory Visit: Payer: Self-pay | Admitting: Cardiovascular Disease

## 2024-08-27 DIAGNOSIS — I480 Paroxysmal atrial fibrillation: Secondary | ICD-10-CM

## 2024-09-11 ENCOUNTER — Encounter: Payer: Self-pay | Admitting: Physician Assistant

## 2024-09-23 ENCOUNTER — Other Ambulatory Visit (INDEPENDENT_AMBULATORY_CARE_PROVIDER_SITE_OTHER)

## 2024-09-23 DIAGNOSIS — E876 Hypokalemia: Secondary | ICD-10-CM | POA: Diagnosis not present

## 2024-09-23 LAB — COMPREHENSIVE METABOLIC PANEL WITH GFR
ALT: 25 U/L (ref 0–53)
AST: 31 U/L (ref 0–37)
Albumin: 3.9 g/dL (ref 3.5–5.2)
Alkaline Phosphatase: 80 U/L (ref 39–117)
BUN: 12 mg/dL (ref 6–23)
CO2: 30 meq/L (ref 19–32)
Calcium: 9 mg/dL (ref 8.4–10.5)
Chloride: 103 meq/L (ref 96–112)
Creatinine, Ser: 1.1 mg/dL (ref 0.40–1.50)
GFR: 68.11 mL/min (ref >60.00)
Glucose, Bld: 127 mg/dL — ABNORMAL HIGH (ref 70–99)
Potassium: 3.8 meq/L (ref 3.5–5.1)
Sodium: 139 meq/L (ref 135–145)
Total Bilirubin: 0.5 mg/dL (ref 0.2–1.2)
Total Protein: 8.1 g/dL (ref 6.0–8.3)

## 2024-09-24 ENCOUNTER — Ambulatory Visit: Attending: Cardiovascular Disease

## 2024-09-24 DIAGNOSIS — I480 Paroxysmal atrial fibrillation: Secondary | ICD-10-CM

## 2024-09-24 DIAGNOSIS — Z5181 Encounter for therapeutic drug level monitoring: Secondary | ICD-10-CM | POA: Diagnosis not present

## 2024-09-24 LAB — POCT INR: INR: 1.8 — AB (ref 2.0–3.0)

## 2024-09-24 NOTE — Patient Instructions (Signed)
 Take 1.5 tablets today only then Continue 1 tablet daily, except 1.5 tablets on Tuesdays AND Thursdays. Recheck INR in 6 weeks. Greens 2 per week. Please call Anticoagulation Clinic [phone]

## 2024-09-28 ENCOUNTER — Ambulatory Visit: Payer: Self-pay | Admitting: Family

## 2024-11-05 ENCOUNTER — Ambulatory Visit: Attending: Cardiovascular Disease

## 2024-11-05 DIAGNOSIS — Z5181 Encounter for therapeutic drug level monitoring: Secondary | ICD-10-CM

## 2024-11-05 DIAGNOSIS — I480 Paroxysmal atrial fibrillation: Secondary | ICD-10-CM

## 2024-11-05 LAB — POCT INR: INR: 1.8 — AB (ref 2.0–3.0)

## 2024-11-05 NOTE — Patient Instructions (Signed)
 Today take 1.5 tablets only then Increase to 1 tablet daily, except 1.5 tablets on Tuesdays, Thursdays and Saturdays. Recheck INR in 6 weeks. Greens 2 per week. Please call Anticoagulation Clinic [phone]

## 2024-12-17 ENCOUNTER — Ambulatory Visit

## 2025-07-29 ENCOUNTER — Ambulatory Visit
